# Patient Record
Sex: Female | Born: 1975
Health system: Southern US, Community
[De-identification: ages and names within clinical notes are randomized; demographics above are authoritative.]

## PROBLEM LIST (undated history)

## (undated) ENCOUNTER — Ambulatory Visit: Admission: EM | Payer: 59 | Source: Home / Self Care

## (undated) DIAGNOSIS — I2699 Other pulmonary embolism without acute cor pulmonale: Secondary | ICD-10-CM

## (undated) DIAGNOSIS — K219 Gastro-esophageal reflux disease without esophagitis: Secondary | ICD-10-CM

## (undated) DIAGNOSIS — R600 Localized edema: Secondary | ICD-10-CM

## (undated) DIAGNOSIS — F419 Anxiety disorder, unspecified: Secondary | ICD-10-CM

## (undated) HISTORY — PX: DILITATION & CURRETTAGE/HYSTROSCOPY WITH ESSURE: SHX5573

## (undated) HISTORY — DX: Gastro-esophageal reflux disease without esophagitis: K21.9

## (undated) HISTORY — PX: NO PAST SURGERIES: SHX2092

## (undated) HISTORY — DX: Anxiety disorder, unspecified: F41.9

---

## 2001-12-08 HISTORY — PX: CHOLECYSTECTOMY, LAPAROSCOPIC: SHX56

## 2001-12-08 HISTORY — PX: LEEP: SHX91

## 2005-01-15 ENCOUNTER — Emergency Department: Payer: Self-pay | Admitting: Emergency Medicine

## 2009-11-02 ENCOUNTER — Emergency Department: Payer: Self-pay | Admitting: Emergency Medicine

## 2012-07-24 ENCOUNTER — Emergency Department: Payer: Self-pay | Admitting: Unknown Physician Specialty

## 2013-05-31 DIAGNOSIS — N719 Inflammatory disease of uterus, unspecified: Secondary | ICD-10-CM | POA: Insufficient documentation

## 2013-06-24 DIAGNOSIS — F411 Generalized anxiety disorder: Secondary | ICD-10-CM | POA: Insufficient documentation

## 2013-12-21 DIAGNOSIS — F3281 Premenstrual dysphoric disorder: Secondary | ICD-10-CM | POA: Insufficient documentation

## 2015-07-13 DIAGNOSIS — F331 Major depressive disorder, recurrent, moderate: Secondary | ICD-10-CM | POA: Insufficient documentation

## 2016-06-19 DIAGNOSIS — F419 Anxiety disorder, unspecified: Secondary | ICD-10-CM | POA: Insufficient documentation

## 2016-06-19 DIAGNOSIS — I82409 Acute embolism and thrombosis of unspecified deep veins of unspecified lower extremity: Secondary | ICD-10-CM | POA: Insufficient documentation

## 2016-06-19 DIAGNOSIS — R000 Tachycardia, unspecified: Secondary | ICD-10-CM | POA: Insufficient documentation

## 2016-06-19 DIAGNOSIS — F32A Depression, unspecified: Secondary | ICD-10-CM | POA: Insufficient documentation

## 2017-06-04 DIAGNOSIS — E559 Vitamin D deficiency, unspecified: Secondary | ICD-10-CM | POA: Insufficient documentation

## 2018-12-11 ENCOUNTER — Ambulatory Visit
Admission: EM | Admit: 2018-12-11 | Discharge: 2018-12-11 | Disposition: A | Discharge status: Home / Self Care | Payer: Managed Care, Other (non HMO)

## 2018-12-11 ENCOUNTER — Other Ambulatory Visit: Payer: Self-pay

## 2018-12-11 DIAGNOSIS — J111 Influenza due to unidentified influenza virus with other respiratory manifestations: Secondary | ICD-10-CM | POA: Insufficient documentation

## 2018-12-11 LAB — RAPID INFLUENZA A&B ANTIGENS
Influenza A (ARMC): NEGATIVE
Influenza B (ARMC): POSITIVE — AB

## 2018-12-11 MED ORDER — HYDROCOD POLST-CPM POLST ER 10-8 MG/5ML PO SUER: 5.0000 mL | Freq: Two times a day (BID) | ORAL | 0 refills | Status: DC

## 2018-12-11 MED ORDER — BENZONATATE 200 MG PO CAPS: ORAL_CAPSULE | ORAL | 0 refills | Status: DC

## 2018-12-11 NOTE — ED Triage Notes (Signed)
Patient complains of body aches, headache and cough x Wednesday. Denies fever.

## 2018-12-11 NOTE — ED Provider Notes (Signed)
[facility]    CSN: [id number] Arrival date & time: 12/11/18  1005     History   Chief Complaint Chief Complaint  Patient presents with  . Generalized Body Aches    HPI Courtney Hanson is a 43 y.o. female.   HPI  Female presents with body aches headache and cough that she has had since Wednesday days prior to this visit.  She states that she did have her flu shot earlier this year.  She denies any fevers is afebrile at the present time.  Patient is requesting a flu test.        History reviewed. No pertinent past medical history.  There are no active problems to display for this patient.   Past Surgical History:  Procedure Laterality Date  . NO PAST SURGERIES      OB History   No obstetric history on file.      Home Medications    Prior to Admission medications   Medication Sig Start Date End Date Taking? Authorizing Provider  citalopram (CELEXA) 20 MG tablet Take by mouth. 10/22/18 10/22/19 Yes [provider]  benzonatate (TESSALON) 200 MG capsule Take one cap TID PRN cough 12/11/18   Lorin Picket, PA-C  chlorpheniramine-HYDROcodone (TUSSIONEX PENNKINETIC ER) 10-8 MG/5ML SUER Take 5 mLs by mouth 2 (two) times daily. 12/11/18   Lorin Picket, PA-C    Family History Family History  Problem Relation Age of Onset  . Hypertension Mother   . Diabetes Mother   . Hypertension Father     Social History Social History   Tobacco Use  . Smoking status: Never Smoker  . Smokeless tobacco: Never Used  Substance Use Topics  . Alcohol use: Not Currently  . Drug use: Not Currently     Allergies   Patient has no known allergies.   Review of Systems Review of Systems  Constitutional: Positive for activity change and fatigue. Negative for appetite change, chills and fever.  HENT: Positive for congestion and sore throat.   Respiratory: Positive for cough.   All other systems reviewed and are negative.    Physical Exam Triage  Vital Signs ED Triage Vitals  Enc Vitals Group     BP 12/11/18 1024 105/80     Pulse Rate 12/11/18 1024 87     Resp 12/11/18 1024 18     Temp 12/11/18 1024 98.6 F (37 C)     Temp Source 12/11/18 1024 Oral     SpO2 12/11/18 1024 99 %     Weight 12/11/18 1022 270 lb (122.5 kg)     Height 12/11/18 1022 5\' 1"  (1.549 m)     Head Circumference --      Peak Flow --      Pain Score 12/11/18 1022 4     Pain Loc --      Pain Edu? --      Excl. in [place]? --    No data found.  Updated Vital Signs BP 105/80 (BP Location: Left Arm)   Pulse 87   Temp 98.6 F (37 C) (Oral)   Resp 18   Ht 5\' 1"  (1.549 m)   Wt 270 lb (122.5 kg)   LMP 11/17/2018   SpO2 99%   BMI 51.02 kg/m   Visual Acuity Right Eye Distance:   Left Eye Distance:   Bilateral Distance:    Right Eye Near:   Left Eye Near:    Bilateral Near:     Physical Exam  Vitals signs and nursing note reviewed.  Constitutional:      General: She is not in acute distress.    Appearance: Normal appearance. She is obese. She is not ill-appearing, toxic-appearing or diaphoretic.  HENT:     Head: Normocephalic and atraumatic.     Right Ear: Tympanic membrane, ear canal and external ear normal.     Left Ear: Tympanic membrane, ear canal and external ear normal.     Nose: Nose normal. No congestion or rhinorrhea.     Mouth/Throat:     Mouth: Mucous membranes are moist.     Pharynx: No oropharyngeal exudate or posterior oropharyngeal erythema.  Eyes:     General:        Right eye: No discharge.        Left eye: No discharge.     Conjunctiva/sclera: Conjunctivae normal.  Neck:     Musculoskeletal: Normal range of motion.  Cardiovascular:     Rate and Rhythm: Normal rate and regular rhythm.  Pulmonary:     Effort: Pulmonary effort is normal.     Breath sounds: Normal breath sounds.  Musculoskeletal: Normal range of motion.  Lymphadenopathy:     Cervical: No cervical adenopathy.  Skin:    General: Skin is warm and dry.    Neurological:     General: No focal deficit present.     Mental Status: She is alert and oriented to person, place, and time.  Psychiatric:        Mood and Affect: Mood normal.        Behavior: Behavior normal.        Thought Content: Thought content normal.        Judgment: Judgment normal.      UC Treatments / Results  Labs (all labs ordered are listed, but only abnormal results are displayed) Labs Reviewed  RAPID INFLUENZA A&B ANTIGENS ([facility] ONLY) - Abnormal; Notable for the following components:      Result Value   Influenza B ([facility]) POSITIVE (*)    All other components within normal limits    EKG None  Radiology No results found.  Procedures Procedures (including critical care time)  Medications Ordered in UC Medications - No data to display  Initial Impression / Assessment and Plan / UC Course  I have reviewed the triage vital signs and the nursing notes.  Pertinent labs & imaging results that were available during my care of the patient were reviewed by me and considered in my medical decision making (see chart for details).    Has influenza B.  Not wish to use the Tamiflu.  Treat the cough symptomatically.  Keep her out of work on Monday and allow her to return to work on Tuesday.   Final Clinical Impressions(s) / UC Diagnoses   Final diagnoses:  Influenza   Discharge Instructions   None    ED Prescriptions    Medication Sig Dispense Auth. Provider   benzonatate (TESSALON) 200 MG capsule Take one cap TID PRN cough 30 capsule Lorin Picket, PA-C   chlorpheniramine-HYDROcodone (TUSSIONEX PENNKINETIC ER) 10-8 MG/5ML SUER Take 5 mLs by mouth 2 (two) times daily. 115 mL Lorin Picket, PA-C     Controlled Substance Prescriptions [place] Controlled Substance Registry consulted? Not Applicable   Lorin Picket, PA-C 12/11/18 1213

## 2019-09-23 DIAGNOSIS — G8929 Other chronic pain: Secondary | ICD-10-CM | POA: Insufficient documentation

## 2020-01-05 ENCOUNTER — Other Ambulatory Visit: Payer: Self-pay

## 2020-01-05 ENCOUNTER — Ambulatory Visit
Admission: EM | Admit: 2020-01-05 | Discharge: 2020-01-05 | Disposition: A | Discharge status: Home / Self Care | Payer: 59 | Attending: Physician Assistant | Admitting: Physician Assistant

## 2020-01-05 DIAGNOSIS — Z113 Encounter for screening for infections with a predominantly sexual mode of transmission: Secondary | ICD-10-CM | POA: Insufficient documentation

## 2020-01-05 NOTE — Discharge Instructions (Addendum)
Cytology, blood test sent, you will be contacted with any positive results that requires further treatment. Monitor for any worsening of symptoms, fever, abdominal pain, nausea, vomiting, to follow up for reevaluation.

## 2020-01-05 NOTE — ED Provider Notes (Signed)
[name]    CSN: [id number] Arrival date & time: 01/05/20  1730      History   Chief Complaint Chief Complaint  Patient presents with  . SEXUALLY TRANSMITTED DISEASE    HPI Courtney Hanson is a 44 y.o. female.   44 year old female comes in for STD testing. States had unprotected intercourse 1-2 weeks ago. She is asymptomatic. Denies urinary frequency, dysuria, hematuria. Denies abdominal pain, nausea, vomiting. Denies fever, chills, flank/back pain. Denies vaginal discharge, itching, spotting. LMP 12/29/2019.     History reviewed. No pertinent past medical history.  There are no problems to display for this patient.   Past Surgical History:  Procedure Laterality Date  . NO PAST SURGERIES      OB History   No obstetric history on file.      Home Medications    Prior to Admission medications   Medication Sig Start Date End Date Taking? Authorizing Provider  citalopram (CELEXA) 20 MG tablet Take by mouth. 10/22/18 10/22/19  [provider]    Family History Family History  Problem Relation Age of Onset  . Hypertension Mother   . Diabetes Mother   . Hypertension Father     Social History Social History   Tobacco Use  . Smoking status: Never Smoker  . Smokeless tobacco: Never Used  Substance Use Topics  . Alcohol use: Not Currently  . Drug use: Not Currently     Allergies   Patient has no known allergies.   Review of Systems Review of Systems  Reason unable to perform ROS: See HPI as above.     Physical Exam Triage Vital Signs ED Triage Vitals [01/05/20 1741]  Enc Vitals Group     BP (!) 145/95     Pulse Rate 98     Resp 18     Temp 98.2 F (36.8 C)     Temp Source Oral     SpO2 96 %     Weight      Height      Head Circumference      Peak Flow      Pain Score 0     Pain Loc      Pain Edu?      Excl. in [place]?    No data found.  Updated Vital Signs BP (!) 145/95 (BP Location: Left Arm)   Pulse 98    Temp 98.2 F (36.8 C) (Oral)   Resp 18   LMP 12/29/2019   SpO2 96%   Physical Exam Constitutional:      General: She is not in acute distress.    Appearance: Normal appearance. She is well-developed. She is not toxic-appearing or diaphoretic.  HENT:     Head: Normocephalic and atraumatic.  Eyes:     Conjunctiva/sclera: Conjunctivae normal.     Pupils: Pupils are equal, round, and reactive to light.  Pulmonary:     Effort: Pulmonary effort is normal. No respiratory distress.     Comments: Speaking in full sentences without difficulty Musculoskeletal:     Cervical back: Normal range of motion and neck supple.  Skin:    General: Skin is warm and dry.  Neurological:     Mental Status: She is alert and oriented to person, place, and time.    UC Treatments / Results  Labs (all labs ordered are listed, but only abnormal results are displayed) Labs Reviewed  HIV ANTIBODY (ROUTINE TESTING W REFLEX)  RPR  CERVICOVAGINAL ANCILLARY  ONLY    EKG   Radiology No results found.  Procedures Procedures (including critical care time)  Medications Ordered in UC Medications - No data to display  Initial Impression / Assessment and Plan / UC Course  I have reviewed the triage vital signs and the nursing notes.  Pertinent labs & imaging results that were available during my care of the patient were reviewed by me and considered in my medical decision making (see chart for details).    Cytology, HIV, RPR sent, patient will be contacted with any positive results that require additional treatment. Patient to refrain from sexual activity for the next 7 days. Return precautions given.   Final Clinical Impressions(s) / UC Diagnoses   Final diagnoses:  Screen for STD (sexually transmitted disease)   ED Prescriptions    None     PDMP not reviewed this encounter.   Ok Edwards, PA-C 01/05/20 1752

## 2020-01-05 NOTE — ED Triage Notes (Signed)
Pt requesting STD testing. States had un protective intercourse. Pt denies any sx's

## 2020-01-07 LAB — HIV ANTIBODY (ROUTINE TESTING W REFLEX): HIV Screen 4th Generation wRfx: NONREACTIVE

## 2020-01-07 LAB — RPR: RPR Ser Ql: NONREACTIVE

## 2020-01-09 LAB — CERVICOVAGINAL ANCILLARY ONLY
Chlamydia: NEGATIVE
Neisseria Gonorrhea: NEGATIVE
Trichomonas: NEGATIVE

## 2020-02-15 ENCOUNTER — Ambulatory Visit: Payer: Self-pay | Admitting: Internal Medicine

## 2020-04-10 DIAGNOSIS — R350 Frequency of micturition: Secondary | ICD-10-CM | POA: Diagnosis not present

## 2020-04-10 DIAGNOSIS — B373 Candidiasis of vulva and vagina: Secondary | ICD-10-CM | POA: Diagnosis not present

## 2020-04-10 DIAGNOSIS — N898 Other specified noninflammatory disorders of vagina: Secondary | ICD-10-CM | POA: Diagnosis not present

## 2020-05-04 DIAGNOSIS — Z9049 Acquired absence of other specified parts of digestive tract: Secondary | ICD-10-CM | POA: Diagnosis not present

## 2020-05-04 DIAGNOSIS — R079 Chest pain, unspecified: Secondary | ICD-10-CM | POA: Diagnosis not present

## 2020-05-04 DIAGNOSIS — R110 Nausea: Secondary | ICD-10-CM | POA: Diagnosis not present

## 2020-05-04 DIAGNOSIS — E559 Vitamin D deficiency, unspecified: Secondary | ICD-10-CM | POA: Diagnosis not present

## 2020-05-04 DIAGNOSIS — F329 Major depressive disorder, single episode, unspecified: Secondary | ICD-10-CM | POA: Diagnosis not present

## 2020-05-04 DIAGNOSIS — Z6841 Body Mass Index (BMI) 40.0 and over, adult: Secondary | ICD-10-CM | POA: Diagnosis not present

## 2020-05-04 DIAGNOSIS — K219 Gastro-esophageal reflux disease without esophagitis: Secondary | ICD-10-CM | POA: Diagnosis not present

## 2020-05-04 DIAGNOSIS — Z7901 Long term (current) use of anticoagulants: Secondary | ICD-10-CM | POA: Diagnosis not present

## 2020-05-04 DIAGNOSIS — I2699 Other pulmonary embolism without acute cor pulmonale: Secondary | ICD-10-CM | POA: Diagnosis not present

## 2020-05-04 DIAGNOSIS — F411 Generalized anxiety disorder: Secondary | ICD-10-CM | POA: Diagnosis not present

## 2020-05-04 DIAGNOSIS — R0602 Shortness of breath: Secondary | ICD-10-CM | POA: Diagnosis not present

## 2020-05-08 DIAGNOSIS — Z823 Family history of stroke: Secondary | ICD-10-CM | POA: Diagnosis not present

## 2020-05-08 DIAGNOSIS — R1013 Epigastric pain: Secondary | ICD-10-CM | POA: Diagnosis not present

## 2020-05-08 DIAGNOSIS — R110 Nausea: Secondary | ICD-10-CM | POA: Diagnosis not present

## 2020-05-08 DIAGNOSIS — Z833 Family history of diabetes mellitus: Secondary | ICD-10-CM | POA: Diagnosis not present

## 2020-05-08 DIAGNOSIS — Z7901 Long term (current) use of anticoagulants: Secondary | ICD-10-CM | POA: Diagnosis not present

## 2020-05-08 DIAGNOSIS — Z86718 Personal history of other venous thrombosis and embolism: Secondary | ICD-10-CM | POA: Diagnosis not present

## 2020-05-08 DIAGNOSIS — R197 Diarrhea, unspecified: Secondary | ICD-10-CM | POA: Diagnosis not present

## 2020-05-08 DIAGNOSIS — Z9049 Acquired absence of other specified parts of digestive tract: Secondary | ICD-10-CM | POA: Diagnosis not present

## 2020-05-08 DIAGNOSIS — Z8249 Family history of ischemic heart disease and other diseases of the circulatory system: Secondary | ICD-10-CM | POA: Diagnosis not present

## 2020-05-10 ENCOUNTER — Other Ambulatory Visit: Payer: Self-pay | Admitting: Family Medicine

## 2020-05-10 DIAGNOSIS — I2699 Other pulmonary embolism without acute cor pulmonale: Secondary | ICD-10-CM | POA: Diagnosis not present

## 2020-05-11 DIAGNOSIS — I2699 Other pulmonary embolism without acute cor pulmonale: Secondary | ICD-10-CM | POA: Diagnosis not present

## 2020-05-11 DIAGNOSIS — E669 Obesity, unspecified: Secondary | ICD-10-CM | POA: Diagnosis not present

## 2020-05-11 DIAGNOSIS — Z7901 Long term (current) use of anticoagulants: Secondary | ICD-10-CM | POA: Diagnosis not present

## 2020-05-11 DIAGNOSIS — M79652 Pain in left thigh: Secondary | ICD-10-CM | POA: Diagnosis not present

## 2020-05-11 DIAGNOSIS — Z86718 Personal history of other venous thrombosis and embolism: Secondary | ICD-10-CM | POA: Diagnosis not present

## 2020-05-15 DIAGNOSIS — Z1231 Encounter for screening mammogram for malignant neoplasm of breast: Secondary | ICD-10-CM | POA: Diagnosis not present

## 2020-05-19 DIAGNOSIS — Z79899 Other long term (current) drug therapy: Secondary | ICD-10-CM | POA: Diagnosis not present

## 2020-05-19 DIAGNOSIS — F329 Major depressive disorder, single episode, unspecified: Secondary | ICD-10-CM | POA: Diagnosis not present

## 2020-05-19 DIAGNOSIS — H81399 Other peripheral vertigo, unspecified ear: Secondary | ICD-10-CM | POA: Diagnosis not present

## 2020-05-19 DIAGNOSIS — Z7901 Long term (current) use of anticoagulants: Secondary | ICD-10-CM | POA: Diagnosis not present

## 2020-05-19 DIAGNOSIS — Z86718 Personal history of other venous thrombosis and embolism: Secondary | ICD-10-CM | POA: Diagnosis not present

## 2020-05-19 DIAGNOSIS — E559 Vitamin D deficiency, unspecified: Secondary | ICD-10-CM | POA: Diagnosis not present

## 2020-05-19 DIAGNOSIS — I2699 Other pulmonary embolism without acute cor pulmonale: Secondary | ICD-10-CM | POA: Diagnosis not present

## 2020-05-19 DIAGNOSIS — Z6841 Body Mass Index (BMI) 40.0 and over, adult: Secondary | ICD-10-CM | POA: Diagnosis not present

## 2020-05-19 DIAGNOSIS — R519 Headache, unspecified: Secondary | ICD-10-CM | POA: Diagnosis not present

## 2020-05-22 ENCOUNTER — Ambulatory Visit
Admission: EM | Admit: 2020-05-22 | Discharge: 2020-05-22 | Disposition: A | Discharge status: Home / Self Care | Payer: 59 | Attending: Emergency Medicine | Admitting: Emergency Medicine

## 2020-05-22 DIAGNOSIS — Z113 Encounter for screening for infections with a predominantly sexual mode of transmission: Secondary | ICD-10-CM | POA: Diagnosis not present

## 2020-05-22 DIAGNOSIS — Z7251 High risk heterosexual behavior: Secondary | ICD-10-CM

## 2020-05-22 DIAGNOSIS — Z114 Encounter for screening for human immunodeficiency virus [HIV]: Secondary | ICD-10-CM

## 2020-05-22 HISTORY — DX: Other pulmonary embolism without acute cor pulmonale: I26.99

## 2020-05-22 NOTE — ED Provider Notes (Signed)
[name]    CSN: [id number] Arrival date & time: 05/22/20  1738      History   Chief Complaint Chief Complaint  Patient presents with  . SEXUALLY TRANSMITTED DISEASE    HPI Courtney Hanson is a 44 y.o. female presenting for STI testing.  States she had unprotected intercourse a week ago: Requesting RPR and HIV as well.  Denies history of this.  No confirmed exposure or symptoms such as pelvic or vaginal pain, discharge, urinary symptoms.   Past Medical History:  Diagnosis Date  . Pulmonary embolism (HCC)     There are no problems to display for this patient.   Past Surgical History:  Procedure Laterality Date  . NO PAST SURGERIES      OB History   No obstetric history on file.      Home Medications    Prior to Admission medications   Medication Sig Start Date End Date Taking? Authorizing Provider  apixaban (ELIQUIS) 5 MG TABS tablet Take 5 mg by mouth 2 (two) times daily.   Yes [provider]  FLUoxetine (PROZAC) 20 MG tablet Take 20 mg by mouth daily.   Yes [provider]    Family History Family History  Problem Relation Age of Onset  . Hypertension Mother   . Diabetes Mother   . Hypertension Father     Social History Social History   Tobacco Use  . Smoking status: Never Smoker  . Smokeless tobacco: Never Used  Vaping Use  . Vaping Use: Never used  Substance Use Topics  . Alcohol use: Not Currently  . Drug use: Not Currently     Allergies   Patient has no known allergies.   Review of Systems As per HPI   Physical Exam Triage Vital Signs ED Triage Vitals  Enc Vitals Group     BP      Pulse      Resp      Temp      Temp src      SpO2      Weight      Height      Head Circumference      Peak Flow      Pain Score      Pain Loc      Pain Edu?      Excl. in [place]?    No data found.  Updated Vital Signs BP 120/84 (BP Location: Left Arm)   Pulse 97   Temp 98.2 F (36.8 C) (Oral)   Resp 18    LMP 05/18/2020   SpO2 98%   Visual Acuity Right Eye Distance:   Left Eye Distance:   Bilateral Distance:    Right Eye Near:   Left Eye Near:    Bilateral Near:     Physical Exam Constitutional:      General: She is not in acute distress. HENT:     Head: Normocephalic and atraumatic.  Eyes:     General: No scleral icterus.    Pupils: Pupils are equal, round, and reactive to light.  Cardiovascular:     Rate and Rhythm: Normal rate.  Pulmonary:     Effort: Pulmonary effort is normal.  Abdominal:     General: Bowel sounds are normal.     Palpations: Abdomen is soft.     Tenderness: There is no abdominal tenderness. There is no right CVA tenderness, left CVA tenderness or guarding.  Genitourinary:    Comments: Patient declined,  self-swab performed Skin:    Coloration: Skin is not jaundiced or pale.  Neurological:     Mental Status: She is alert and oriented to person, place, and time.      UC Treatments / Results  Labs (all labs ordered are listed, but only abnormal results are displayed) Labs Reviewed  HIV ANTIBODY (ROUTINE TESTING W REFLEX)  RPR  CERVICOVAGINAL ANCILLARY ONLY    EKG   Radiology No results found.  Procedures Procedures (including critical care time)  Medications Ordered in UC Medications - No data to display  Initial Impression / Assessment and Plan / UC Course  I have reviewed the triage vital signs and the nursing notes.  Pertinent labs & imaging results that were available during my care of the patient were reviewed by me and considered in my medical decision making (see chart for details).     Testing pending: We will treat if indicated.  Return precautions discussed, patient verbalized understanding and is agreeable to plan. Final Clinical Impressions(s) / UC Diagnoses   Final diagnoses:  Screening examination for venereal disease  Screening for human immunodeficiency virus  Unprotected sex     Discharge Instructions       Please check MyChart for test results.    ED Prescriptions    None     PDMP not reviewed this encounter.   [place], [place], [place] 05/22/20 1827

## 2020-05-22 NOTE — ED Triage Notes (Signed)
Pt requesting STD testing. States had un protective intercourse a week ago. Pt denies any s/x's

## 2020-05-22 NOTE — Discharge Instructions (Signed)
Please check MyChart for test results.

## 2020-05-24 LAB — CERVICOVAGINAL ANCILLARY ONLY
Chlamydia: NEGATIVE
Comment: NEGATIVE
Comment: NEGATIVE
Comment: NORMAL
Neisseria Gonorrhea: NEGATIVE
Trichomonas: NEGATIVE

## 2020-05-24 LAB — RPR: RPR Ser Ql: NONREACTIVE

## 2020-05-24 LAB — HIV ANTIBODY (ROUTINE TESTING W REFLEX): HIV Screen 4th Generation wRfx: NONREACTIVE

## 2020-06-01 MED FILL — ELIQUIS 5 MG TABLET: 5 | 30 days supply | Qty: 60 | Fill #0

## 2020-06-29 MED FILL — ELIQUIS 5 MG TABLET: 5 | 30 days supply | Qty: 60 | Fill #1

## 2020-07-27 MED FILL — ELIQUIS 5 MG TABLET: 5 | 30 days supply | Qty: 60 | Fill #2

## 2020-08-22 DIAGNOSIS — Z6841 Body Mass Index (BMI) 40.0 and over, adult: Secondary | ICD-10-CM | POA: Diagnosis not present

## 2020-08-22 DIAGNOSIS — Z113 Encounter for screening for infections with a predominantly sexual mode of transmission: Secondary | ICD-10-CM | POA: Diagnosis not present

## 2020-08-22 DIAGNOSIS — Z20822 Contact with and (suspected) exposure to covid-19: Secondary | ICD-10-CM | POA: Diagnosis not present

## 2020-10-08 DIAGNOSIS — Z7901 Long term (current) use of anticoagulants: Secondary | ICD-10-CM | POA: Diagnosis not present

## 2020-10-08 DIAGNOSIS — I2699 Other pulmonary embolism without acute cor pulmonale: Secondary | ICD-10-CM | POA: Diagnosis not present

## 2020-10-08 DIAGNOSIS — I824Y2 Acute embolism and thrombosis of unspecified deep veins of left proximal lower extremity: Secondary | ICD-10-CM | POA: Diagnosis not present

## 2020-10-08 DIAGNOSIS — Z6841 Body Mass Index (BMI) 40.0 and over, adult: Secondary | ICD-10-CM | POA: Diagnosis not present

## 2020-10-18 DIAGNOSIS — F331 Major depressive disorder, recurrent, moderate: Secondary | ICD-10-CM | POA: Diagnosis not present

## 2020-10-18 DIAGNOSIS — R7303 Prediabetes: Secondary | ICD-10-CM | POA: Insufficient documentation

## 2020-10-18 DIAGNOSIS — Z6841 Body Mass Index (BMI) 40.0 and over, adult: Secondary | ICD-10-CM | POA: Diagnosis not present

## 2020-12-06 ENCOUNTER — Other Ambulatory Visit: Payer: Self-pay | Admitting: Hematology and Oncology

## 2020-12-17 DIAGNOSIS — H524 Presbyopia: Secondary | ICD-10-CM | POA: Diagnosis not present

## 2020-12-28 ENCOUNTER — Other Ambulatory Visit: Payer: 59 | Attending: Family Medicine

## 2021-01-02 ENCOUNTER — Encounter: Payer: Self-pay | Admitting: Emergency Medicine

## 2021-01-02 ENCOUNTER — Ambulatory Visit
Admission: EM | Admit: 2021-01-02 | Discharge: 2021-01-02 | Disposition: A | Discharge status: Home / Self Care | Payer: 59 | Attending: Physician Assistant | Admitting: Physician Assistant

## 2021-01-02 ENCOUNTER — Other Ambulatory Visit: Payer: Self-pay

## 2021-01-02 DIAGNOSIS — Z113 Encounter for screening for infections with a predominantly sexual mode of transmission: Secondary | ICD-10-CM | POA: Diagnosis not present

## 2021-01-02 DIAGNOSIS — N760 Acute vaginitis: Secondary | ICD-10-CM | POA: Diagnosis not present

## 2021-01-02 DIAGNOSIS — B9689 Other specified bacterial agents as the cause of diseases classified elsewhere: Secondary | ICD-10-CM | POA: Diagnosis not present

## 2021-01-02 LAB — WET PREP, GENITAL
Sperm: NONE SEEN
Trich, Wet Prep: NONE SEEN
Yeast Wet Prep HPF POC: NONE SEEN

## 2021-01-02 LAB — CHLAMYDIA/NGC RT PCR (ARMC ONLY)
Chlamydia Tr: NOT DETECTED
N gonorrhoeae: NOT DETECTED

## 2021-01-02 MED ORDER — METRONIDAZOLE 500 MG PO TABS: 500.0000 mg | ORAL_TABLET | Freq: Two times a day (BID) | ORAL | 0 refills | Status: AC

## 2021-01-02 NOTE — ED Triage Notes (Signed)
Pt requesting STD screening. She is not having symptoms but has a possible exposure.

## 2021-01-02 NOTE — Discharge Instructions (Signed)
You tested positive for BV today.  Begin the Flagyl.  We have tested you for gonorrhea and chlamydia and those results will be available within 24 hours.  If 1 of those results is positive you will need treatment and we will let you know.  Results will be accessible through [place].  We will call if results are positive.  Make sure to always use protection when having intercourse.  Practice safe sex.

## 2021-01-02 NOTE — ED Provider Notes (Signed)
[facility]    CSN: [id number] Arrival date & time: 01/02/21  1746      History   Chief Complaint Chief Complaint  Patient presents with  . Exposure to STD    HPI Courtney Hanson is a 45 y.o. female presenting for STI screening.  Patient states that she just found out that her current sexual partner has been having intercourse with other partners.  Patient denies any symptoms and states that he did not tolerate tested positive for anything.  She says that they did not use protection when having intercourse.  She denies any dysuria, vaginal discharge, vaginal lesions or itching.  Patient requesting testing for trichomonas, chlamydia and gonorrhea.  Declines any testing for HIV or syphilis.  Last menstrual period was 12/26/2020.  No other concerns or complaints today.  HPI  Past Medical History:  Diagnosis Date  . Pulmonary embolism (HCC)     There are no problems to display for this patient.   Past Surgical History:  Procedure Laterality Date  . NO PAST SURGERIES      OB History   No obstetric history on file.      Home Medications    Prior to Admission medications   Medication Sig Start Date End Date Taking? Authorizing Provider  apixaban (ELIQUIS) 5 MG TABS tablet Take 5 mg by mouth 2 (two) times daily.   Yes [provider]  FLUoxetine (PROZAC) 20 MG tablet Take 20 mg by mouth daily.   Yes [provider]  metroNIDAZOLE (FLAGYL) 500 MG tablet Take 1 tablet (500 mg total) by mouth 2 (two) times daily for 7 days. 01/02/21 01/09/21 Yes Danton Clap, PA-C    Family History Family History  Problem Relation Age of Onset  . Hypertension Mother   . Diabetes Mother   . Hypertension Father     Social History Social History   Tobacco Use  . Smoking status: Never Smoker  . Smokeless tobacco: Never Used  Vaping Use  . Vaping Use: Never used  Substance Use Topics  . Alcohol use: Not Currently  . Drug use: Not Currently      Allergies   Patient has no known allergies.   Review of Systems Review of Systems  Gastrointestinal: Negative for abdominal pain, nausea and vomiting.  Genitourinary: Negative for dysuria, frequency, genital sores, pelvic pain, vaginal discharge and vaginal pain.  Musculoskeletal: Negative for back pain.  Skin: Negative for rash.     Physical Exam Triage Vital Signs ED Triage Vitals  Enc Vitals Group     BP 01/02/21 1803 122/87     Pulse Rate 01/02/21 1803 88     Resp 01/02/21 1803 18     Temp 01/02/21 1803 98.4 F (36.9 C)     Temp Source 01/02/21 1803 Oral     SpO2 01/02/21 1803 100 %     Weight 01/02/21 1802 270 lb 1 oz (122.5 kg)     Height 01/02/21 1802 5\' 1"  (1.549 m)     Head Circumference --      Peak Flow --      Pain Score 01/02/21 1802 0     Pain Loc --      Pain Edu? --      Excl. in [place]? --    No data found.  Updated Vital Signs BP 122/87 (BP Location: Right Arm)   Pulse 88   Temp 98.4 F (36.9 C) (Oral)   Resp 18   Ht 5'  1" (1.549 m)   Wt 270 lb 1 oz (122.5 kg)   LMP 12/26/2020   SpO2 100%   BMI 51.03 kg/m       Physical Exam Vitals and nursing note reviewed.  Constitutional:      General: She is not in acute distress.    Appearance: Normal appearance. She is obese. She is not ill-appearing or toxic-appearing.  HENT:     Head: Normocephalic and atraumatic.  Eyes:     General: No scleral icterus.       Right eye: No discharge.        Left eye: No discharge.     Conjunctiva/sclera: Conjunctivae normal.  Cardiovascular:     Rate and Rhythm: Normal rate and regular rhythm.     Heart sounds: Normal heart sounds.  Pulmonary:     Effort: Pulmonary effort is normal. No respiratory distress.     Breath sounds: Normal breath sounds.  Abdominal:     Palpations: Abdomen is soft.     Tenderness: There is no abdominal tenderness. There is no right CVA tenderness or left CVA tenderness.  Musculoskeletal:     Cervical back: Neck supple.   Skin:    General: Skin is dry.  Neurological:     General: No focal deficit present.     Mental Status: She is alert. Mental status is at baseline.     Motor: No weakness.     Gait: Gait normal.  Psychiatric:        Mood and Affect: Mood normal.        Behavior: Behavior normal.        Thought Content: Thought content normal.      UC Treatments / Results  Labs (all labs ordered are listed, but only abnormal results are displayed) Labs Reviewed  WET PREP, GENITAL - Abnormal; Notable for the following components:      Result Value   Clue Cells Wet Prep HPF POC PRESENT (*)    WBC, Wet Prep HPF POC FEW (*)    All other components within normal limits  CHLAMYDIA/NGC RT PCR [facility] ONLY)    EKG   Radiology No results found.  Procedures Procedures (including critical care time)  Medications Ordered in UC Medications - No data to display  Initial Impression / Assessment and Plan / UC Course  I have reviewed the triage vital signs and the nursing notes.  Pertinent labs & imaging results that were available during my care of the patient were reviewed by me and considered in my medical decision making (see chart for details).   45 year old asymptomatic female presenting for STI screening.  GC and Chlamydia testing sent out.  Advised patient had access results through [place].  Patient would like to wait for results before being treated since she does not have a positive exposure.  Wet prep is positive for clue cells.  Treating for BV infection at this time with metronidazole.  Advised safe sex in the future.  Advised to follow-up the clinic as needed.   Final Clinical Impressions(s) / UC Diagnoses   Final diagnoses:  Bacterial vaginosis  Screen for STD (sexually transmitted disease)     Discharge Instructions     You tested positive for BV today.  Begin the Flagyl.  We have tested you for gonorrhea and chlamydia and those results will be available within 24  hours.  If 1 of those results is positive you will need treatment and we will let you know.  Results  will be accessible through [place].  We will call if results are positive.  Make sure to always use protection when having intercourse.  Practice safe sex.   ED Prescriptions    Medication Sig Dispense Auth. Provider   metroNIDAZOLE (FLAGYL) 500 MG tablet Take 1 tablet (500 mg total) by mouth 2 (two) times daily for 7 days. 14 tablet Gretta Cool     PDMP not reviewed this encounter.   Danton Clap, PA-C 01/02/21 [phone]

## 2021-01-08 ENCOUNTER — Other Ambulatory Visit: Payer: Self-pay | Admitting: Family Medicine

## 2021-01-21 ENCOUNTER — Telehealth (HOSPITAL_COMMUNITY): Payer: Self-pay | Admitting: Emergency Medicine

## 2021-01-21 MED ORDER — FLUCONAZOLE 150 MG PO TABS: 150.0000 mg | ORAL_TABLET | Freq: Once | ORAL | 0 refills | Status: AC

## 2021-01-21 NOTE — Telephone Encounter (Signed)
Patient called and states she has developed a yeast infection since beginning antibiotics from her visit.  Spoke to [place], [place] who okay'd prescription for Diflucan.   Attempted to inform patient, unable to LVM.  Prescription sent

## 2021-01-30 DIAGNOSIS — M7711 Lateral epicondylitis, right elbow: Secondary | ICD-10-CM | POA: Diagnosis not present

## 2021-01-30 DIAGNOSIS — Z6841 Body Mass Index (BMI) 40.0 and over, adult: Secondary | ICD-10-CM | POA: Diagnosis not present

## 2021-02-08 DIAGNOSIS — Z124 Encounter for screening for malignant neoplasm of cervix: Secondary | ICD-10-CM | POA: Diagnosis not present

## 2021-02-08 DIAGNOSIS — Z0000 Encounter for general adult medical examination without abnormal findings: Secondary | ICD-10-CM | POA: Diagnosis not present

## 2021-02-08 DIAGNOSIS — Z6841 Body Mass Index (BMI) 40.0 and over, adult: Secondary | ICD-10-CM | POA: Diagnosis not present

## 2021-02-08 DIAGNOSIS — E559 Vitamin D deficiency, unspecified: Secondary | ICD-10-CM | POA: Diagnosis not present

## 2021-02-08 DIAGNOSIS — R7303 Prediabetes: Secondary | ICD-10-CM | POA: Diagnosis not present

## 2021-02-08 DIAGNOSIS — Z113 Encounter for screening for infections with a predominantly sexual mode of transmission: Secondary | ICD-10-CM | POA: Diagnosis not present

## 2021-03-09 ENCOUNTER — Other Ambulatory Visit: Payer: Self-pay

## 2021-03-09 MED FILL — Apixaban Tab 5 MG: ORAL | 30 days supply | Qty: 60 | Fill #0 | Status: AC

## 2021-03-11 ENCOUNTER — Other Ambulatory Visit: Payer: Self-pay

## 2021-03-11 MED ORDER — FLUOXETINE HCL 40 MG PO CAPS
40.0000 mg | ORAL_CAPSULE | Freq: Every day | ORAL | 0 refills | Status: DC
  Filled 2021-03-11: qty 90, 90d supply, fill #0
  Filled 2021-06-10: qty 90, 90d supply, fill #1
  Filled 2021-09-07: qty 60, 60d supply, fill #2

## 2021-04-08 MED FILL — Apixaban Tab 5 MG: ORAL | 30 days supply | Qty: 60 | Fill #1 | Status: AC

## 2021-04-09 ENCOUNTER — Other Ambulatory Visit: Payer: Self-pay

## 2021-05-09 ENCOUNTER — Other Ambulatory Visit: Payer: Self-pay

## 2021-05-09 MED FILL — Apixaban Tab 5 MG: ORAL | 30 days supply | Qty: 60 | Fill #2 | Status: AC

## 2021-05-14 DIAGNOSIS — Z124 Encounter for screening for malignant neoplasm of cervix: Secondary | ICD-10-CM | POA: Diagnosis not present

## 2021-05-14 DIAGNOSIS — R87611 Atypical squamous cells cannot exclude high grade squamous intraepithelial lesion on cytologic smear of cervix (ASC-H): Secondary | ICD-10-CM | POA: Diagnosis not present

## 2021-05-14 DIAGNOSIS — R87619 Unspecified abnormal cytological findings in specimens from cervix uteri: Secondary | ICD-10-CM | POA: Diagnosis not present

## 2021-06-04 ENCOUNTER — Other Ambulatory Visit: Payer: Self-pay

## 2021-06-04 ENCOUNTER — Encounter: Payer: Self-pay | Admitting: Emergency Medicine

## 2021-06-04 ENCOUNTER — Ambulatory Visit
Admission: EM | Admit: 2021-06-04 | Discharge: 2021-06-04 | Disposition: A | Discharge status: Home / Self Care | Payer: 59 | Attending: Physician Assistant | Admitting: Physician Assistant

## 2021-06-04 DIAGNOSIS — N898 Other specified noninflammatory disorders of vagina: Secondary | ICD-10-CM | POA: Diagnosis not present

## 2021-06-04 LAB — POCT URINALYSIS DIP (MANUAL ENTRY)
Bilirubin, UA: NEGATIVE
Blood, UA: NEGATIVE
Glucose, UA: NEGATIVE mg/dL
Ketones, POC UA: NEGATIVE mg/dL
Leukocytes, UA: NEGATIVE
Nitrite, UA: NEGATIVE
Protein Ur, POC: NEGATIVE mg/dL
Spec Grav, UA: 1.02 (ref 1.010–1.025)
Urobilinogen, UA: 0.2 E.U./dL
pH, UA: 6.5 (ref 5.0–8.0)

## 2021-06-04 NOTE — ED Triage Notes (Addendum)
White vaginal discharge x 1 week.  Would like to be checked for std's/ yeast infection. Also reports some burning w urination

## 2021-06-04 NOTE — Discharge Instructions (Addendum)
Return if any problems.  Your test are pending

## 2021-06-05 LAB — CERVICOVAGINAL ANCILLARY ONLY
Bacterial Vaginitis (gardnerella): POSITIVE — AB
Candida Glabrata: NEGATIVE
Candida Vaginitis: NEGATIVE
Chlamydia: NEGATIVE
Comment: NEGATIVE
Comment: NEGATIVE
Comment: NEGATIVE
Comment: NEGATIVE
Comment: NEGATIVE
Comment: NORMAL
Neisseria Gonorrhea: NEGATIVE
Trichomonas: NEGATIVE

## 2021-06-06 ENCOUNTER — Telehealth (HOSPITAL_COMMUNITY): Payer: Self-pay | Admitting: Emergency Medicine

## 2021-06-06 MED ORDER — METRONIDAZOLE 500 MG PO TABS: 500.0000 mg | ORAL_TABLET | Freq: Two times a day (BID) | ORAL | 0 refills | Status: DC

## 2021-06-10 MED FILL — Apixaban Tab 5 MG: ORAL | 30 days supply | Qty: 60 | Fill #3 | Status: AC

## 2021-06-11 ENCOUNTER — Other Ambulatory Visit: Payer: Self-pay

## 2021-06-20 DIAGNOSIS — I824Y2 Acute embolism and thrombosis of unspecified deep veins of left proximal lower extremity: Secondary | ICD-10-CM | POA: Diagnosis not present

## 2021-06-20 DIAGNOSIS — R002 Palpitations: Secondary | ICD-10-CM | POA: Diagnosis not present

## 2021-06-20 DIAGNOSIS — D649 Anemia, unspecified: Secondary | ICD-10-CM | POA: Diagnosis not present

## 2021-06-20 DIAGNOSIS — Z6841 Body Mass Index (BMI) 40.0 and over, adult: Secondary | ICD-10-CM | POA: Diagnosis not present

## 2021-06-28 DIAGNOSIS — N879 Dysplasia of cervix uteri, unspecified: Secondary | ICD-10-CM | POA: Diagnosis not present

## 2021-06-28 DIAGNOSIS — R002 Palpitations: Secondary | ICD-10-CM | POA: Diagnosis not present

## 2021-06-28 DIAGNOSIS — R87611 Atypical squamous cells cannot exclude high grade squamous intraepithelial lesion on cytologic smear of cervix (ASC-H): Secondary | ICD-10-CM | POA: Diagnosis not present

## 2021-06-28 DIAGNOSIS — R87619 Unspecified abnormal cytological findings in specimens from cervix uteri: Secondary | ICD-10-CM | POA: Diagnosis not present

## 2021-06-28 DIAGNOSIS — Z6841 Body Mass Index (BMI) 40.0 and over, adult: Secondary | ICD-10-CM | POA: Diagnosis not present

## 2021-06-28 DIAGNOSIS — Z7901 Long term (current) use of anticoagulants: Secondary | ICD-10-CM | POA: Diagnosis not present

## 2021-07-04 DIAGNOSIS — I824Y2 Acute embolism and thrombosis of unspecified deep veins of left proximal lower extremity: Secondary | ICD-10-CM | POA: Diagnosis not present

## 2021-07-04 DIAGNOSIS — R002 Palpitations: Secondary | ICD-10-CM | POA: Diagnosis not present

## 2021-07-12 ENCOUNTER — Other Ambulatory Visit: Payer: Self-pay

## 2021-07-12 MED FILL — Apixaban Tab 5 MG: ORAL | 27 days supply | Qty: 54 | Fill #4 | Status: AC

## 2021-07-12 MED FILL — Apixaban Tab 5 MG: ORAL | 3 days supply | Qty: 6 | Fill #4 | Status: AC

## 2021-07-15 ENCOUNTER — Other Ambulatory Visit: Payer: Self-pay

## 2021-07-16 ENCOUNTER — Other Ambulatory Visit: Payer: Self-pay

## 2021-07-16 MED ORDER — CARESTART COVID-19 HOME TEST VI KIT
PACK | 0 refills | Status: DC
  Filled 2021-07-16: qty 2, 4d supply, fill #0

## 2021-08-05 ENCOUNTER — Other Ambulatory Visit: Payer: Self-pay

## 2021-08-05 DIAGNOSIS — N898 Other specified noninflammatory disorders of vagina: Secondary | ICD-10-CM | POA: Diagnosis not present

## 2021-08-05 DIAGNOSIS — R350 Frequency of micturition: Secondary | ICD-10-CM | POA: Diagnosis not present

## 2021-08-05 MED ORDER — NITROFURANTOIN MONOHYD MACRO 100 MG PO CAPS
ORAL_CAPSULE | ORAL | 0 refills | Status: DC
  Filled 2021-08-05: qty 14, 7d supply, fill #0

## 2021-08-05 MED ORDER — FLUCONAZOLE 150 MG PO TABS
ORAL_TABLET | ORAL | 1 refills | Status: DC
  Filled 2021-08-05: qty 1, 1d supply, fill #0

## 2021-08-08 ENCOUNTER — Other Ambulatory Visit: Payer: Self-pay

## 2021-08-08 MED FILL — Apixaban Tab 5 MG: ORAL | 30 days supply | Qty: 60 | Fill #5 | Status: AC

## 2021-08-09 ENCOUNTER — Other Ambulatory Visit: Payer: Self-pay

## 2021-08-09 MED ORDER — AMOXICILLIN 875 MG PO TABS
ORAL_TABLET | ORAL | 0 refills | Status: DC
  Filled 2021-08-09: qty 14, 7d supply, fill #0

## 2021-08-30 ENCOUNTER — Encounter: Payer: Self-pay | Admitting: *Deleted

## 2021-09-07 MED FILL — Apixaban Tab 5 MG: ORAL | 30 days supply | Qty: 60 | Fill #6 | Status: AC

## 2021-09-09 ENCOUNTER — Other Ambulatory Visit: Payer: Self-pay

## 2021-09-18 ENCOUNTER — Encounter: Payer: Self-pay | Admitting: Gastroenterology

## 2021-09-18 ENCOUNTER — Other Ambulatory Visit: Payer: Self-pay

## 2021-09-18 ENCOUNTER — Ambulatory Visit (INDEPENDENT_AMBULATORY_CARE_PROVIDER_SITE_OTHER): Payer: 59 | Admitting: Gastroenterology

## 2021-09-18 VITALS — BP 123/85 | HR 109 | Temp 97.7°F | Ht 64.0 in | Wt 347.8 lb

## 2021-09-18 DIAGNOSIS — K219 Gastro-esophageal reflux disease without esophagitis: Secondary | ICD-10-CM

## 2021-09-18 DIAGNOSIS — K529 Noninfective gastroenteritis and colitis, unspecified: Secondary | ICD-10-CM | POA: Diagnosis not present

## 2021-09-18 DIAGNOSIS — R110 Nausea: Secondary | ICD-10-CM | POA: Diagnosis not present

## 2021-09-18 MED ORDER — PEG 3350-KCL-NA BICARB-NACL 420 G PO SOLR
4000.0000 mL | Freq: Once | ORAL | 0 refills | Status: AC
  Filled 2021-09-18: qty 4000, 1d supply, fill #0

## 2021-09-18 NOTE — Addendum Note (Signed)
Addended by: Cephas Darby on: 09/18/2021 03:29 PM   Modules accepted: Orders

## 2021-09-18 NOTE — Progress Notes (Addendum)
Cephas Darby, MD [address]  [place]  [place],  [address]  Main: [phone]  Fax: [phone]    Gastroenterology Consultation  Referring Provider:     Dola Argyle, MD Primary Care Physician:  Dola Argyle, MD Primary Gastroenterologist:  Dr. Cephas Darby Reason for Consultation:     Chronic nausea and chronic diarrhea        HPI:   Courtney Hanson is a 45 y.o. female referred by Dr. Dola Argyle, MD  for consultation & management of chronic nausea.  Patient reports that she has been experiencing episodes of severe nausea, particularly early morning after she wakes up and sometimes associated with regurgitation of food.  Her nausea episodes are generally associated with postprandial nonbloody diarrhea.  These episodes occur few times a week.  She takes over-the-counter PPI as needed which provides some relief.  This has been ongoing for several months.  Patient has history of PE, currently on Eliquis.  Patient does report some epigastric discomfort and bloating.  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 [place]  NSAIDs: None  Antiplts/Anticoagulants/Anti thrombotics: None  GI Procedures: None She denies family history of GI malignancy  Past Medical History:  Diagnosis Date   Pulmonary embolism ([place])     Past Surgical History:  Procedure Laterality Date   NO PAST SURGERIES     Current Outpatient Medications:    albuterol (VENTOLIN HFA) 108 (90 Base) MCG/ACT inhaler, Inhale into the lungs., Disp: , Rfl:    ALPRAZolam (XANAX) 0.5 MG tablet, Take by mouth., Disp: , Rfl:    apixaban (ELIQUIS) 5 MG TABS tablet, TAKE 1 TABLET BY MOUTH TWICE DAILY, Disp: 60 tablet, Rfl: 10   clonazePAM (KLONOPIN) 1 MG tablet, Take by mouth., Disp: , Rfl:    COVID-19 At Home Antigen Test (CARESTART COVID-19 HOME TEST) KIT, use as directed, Disp: 2 kit, Rfl: 0    cyclobenzaprine (FLEXERIL) 5 MG tablet, Take by mouth., Disp: , Rfl:    fluconazole (DIFLUCAN) 150 MG tablet, Take one tablet (150 mg dose) by mouth once for 1 dose. Take one tablet by mouth as a single dose., Disp: 1 tablet, Rfl: 1   FLUoxetine (PROZAC) 20 MG tablet, Take 20 mg by mouth daily., Disp: , Rfl:    furosemide (LASIX) 20 MG tablet, Take by mouth., Disp: , Rfl:    LORazepam (ATIVAN) 0.5 MG tablet, Take by mouth., Disp: , Rfl:    polyethylene glycol-electrolytes (NULYTELY) 420 g solution, Take 4,000 mLs by mouth once for 1 dose., Disp: 4000 mL, Rfl: 0   potassium chloride (MICRO-K) 10 MEQ CR capsule, Take by mouth., Disp: , Rfl:    promethazine (PHENERGAN) 25 MG tablet, Takes as needed, Disp: , Rfl:    topiramate (TOPAMAX) 25 MG tablet, Take by mouth., Disp: , Rfl:    nitrofurantoin, macrocrystal-monohydrate, (MACROBID) 100 MG capsule, Take one capsule (100 mg dose) by mouth 2 (two) times daily for 7 days. (Patient not taking: Reported on 09/18/2021), Disp: 14 capsule, Rfl: 0   zolpidem (AMBIEN CR) 6.25 MG CR tablet, TAKE 1 TABLET BY MOUTH NIGHTLY AS NEEDED FOR SLEEP, Disp: 30 tablet, Rfl: 0    Family History  Problem Relation Age of Onset   Hypertension Mother    Diabetes Mother    Hypertension Father      Social History   Tobacco Use  Smoking status: Never   Smokeless tobacco: Never  Vaping Use   Vaping Use: Never used  Substance Use Topics   Alcohol use: Not Currently   Drug use: Not Currently    Allergies as of 09/18/2021 - Review Complete 09/18/2021  Allergen Reaction Noted   Tetanus toxoid Dermatitis, Hives, Itching, and Rash 07/13/2015   Topiramate  02/08/2021   Venlafaxine  01/06/2020    Review of Systems:    All systems reviewed and negative except where noted in HPI.   Physical Exam:  BP 123/85 (BP Location: Left Arm, Patient Position: Sitting, Cuff Size: Large)   Pulse (!) 109   Temp 97.7 F (36.5 C) (Oral)   Ht 5' 4"  (1.626 m)   Wt (!) 347 lb  12.8 oz (157.8 kg)   BMI 59.70 kg/m  No LMP recorded.  General:   Alert,  Well-developed, well-nourished, pleasant and cooperative in NAD Head:  Normocephalic and atraumatic. Eyes:  Sclera clear, no icterus.   Conjunctiva pink. Ears:  Normal auditory acuity. Nose:  No deformity, discharge, or lesions. Mouth:  No deformity or lesions,oropharynx pink & moist. Neck:  Supple; no masses or thyromegaly. Lungs:  Respirations even and unlabored.  Clear throughout to auscultation.   No wheezes, crackles, or rhonchi. No acute distress. Heart:  Regular rate and rhythm; no murmurs, clicks, rubs, or gallops. Abdomen:  Normal bowel sounds. Soft, non-tender and non-distended without masses, No guarding or rebound tenderness.   Rectal: Not performed Msk:  Symmetrical without gross deformities. Good, equal movement & strength bilaterally. Pulses:  Normal pulses noted. Extremities:  No clubbing or edema.  No cyanosis. Neurologic:  Alert and oriented x3;  grossly normal neurologically. Skin:  Intact without significant lesions or rashes. No jaundice. Psych:  Alert and cooperative. Normal mood and affect.  Imaging Studies: No abdominal imaging  Assessment and Plan:   Courtney Hanson is a 45 y.o. pleasant African-American female with morbid obesity, BMI 59, history of PE maintained on long-term anticoagulation with Eliquis is seen in consultation for chronic symptoms of nausea without vomiting, chronic nonbloody diarrhea  Early morning nausea without vomiting and intermittent heartburn: Her symptoms are likely suggestive of chronic GERD Recommend EGD for Barrett's screening and gastric biopsies Discussed about GERD lifestyle, information provided  Chronic nonbloody diarrhea Recommend GI profile PCR Check pancreatic fecal elastase levels After above work-up, recommend screening colonoscopy and random colon biopsies if needed  Will obtain blood thinner request from her hematologist for interruption of  Eliquis during periprocedural period  Morbid obesity Provided her with information about bariatric and wellness center at [place], [facility]   Follow up in 4 months   Cephas Darby, MD

## 2021-09-18 NOTE — Patient Instructions (Signed)
Gastroesophageal Reflux Disease, Adult Gastroesophageal reflux (GER) happens when acid from the stomach flows up into the tube that connects the mouth and the stomach (esophagus). Normally, food travels down the esophagus and stays in the stomach to be digested. With GER, food and stomach acid sometimes move back up into the esophagus. You may have a disease called gastroesophageal reflux disease (GERD) if the reflux: Happens often. Causes frequent or very bad symptoms. Causes problems such as damage to the esophagus. When this happens, the esophagus becomes sore and swollen. Over time, GERD can make small holes (ulcers) in the lining of the esophagus. What are the causes? This condition is caused by a problem with the muscle between the esophagus and the stomach. When this muscle is weak or not normal, it does not close properly to keep food and acid from coming back up from the stomach. The muscle can be weak because of: Tobacco use. Pregnancy. Having a certain type of hernia (hiatal hernia). Alcohol use. Certain foods and drinks, such as coffee, chocolate, onions, and peppermint. What increases the risk? Being overweight. Having a disease that affects your connective tissue. Taking NSAIDs, such a ibuprofen. What are the signs or symptoms? Heartburn. Difficult or painful swallowing. The feeling of having a lump in the throat. A bitter taste in the mouth. Bad breath. Having a lot of saliva. Having an upset or bloated stomach. Burping. Chest pain. Different conditions can cause chest pain. Make sure you see your doctor if you have chest pain. Shortness of breath or wheezing. A long-term cough or a cough at night. Wearing away of the surface of teeth (tooth enamel). Weight loss. How is this treated? Making changes to your diet. Taking medicine. Having surgery. Treatment will depend on how bad your symptoms are. Follow these instructions at home: Eating and drinking  Follow a  diet as told by your doctor. You may need to avoid foods and drinks such as: Coffee and tea, with or without caffeine. Drinks that contain alcohol. Energy drinks and sports drinks. Bubbly (carbonated) drinks or sodas. Chocolate and cocoa. Peppermint and mint flavorings. Garlic and onions. Horseradish. Spicy and acidic foods. These include peppers, chili powder, curry powder, vinegar, hot sauces, and BBQ sauce. Citrus fruit juices and citrus fruits, such as oranges, lemons, and limes. Tomato-based foods. These include red sauce, chili, salsa, and pizza with red sauce. Fried and fatty foods. These include donuts, french fries, potato chips, and high-fat dressings. High-fat meats. These include hot dogs, rib eye steak, sausage, ham, and bacon. High-fat dairy items, such as whole milk, butter, and cream cheese. Eat small meals often. Avoid eating large meals. Avoid drinking large amounts of liquid with your meals. Avoid eating meals during the 2-3 hours before bedtime. Avoid lying down right after you eat. Do not exercise right after you eat. Lifestyle  Do not smoke or use any products that contain nicotine or tobacco. If you need help quitting, ask your doctor. Try to lower your stress. If you need help doing this, ask your doctor. If you are overweight, lose an amount of weight that is healthy for you. Ask your doctor about a safe weight loss goal. General instructions Pay attention to any changes in your symptoms. Take over-the-counter and prescription medicines only as told by your doctor. Do not take aspirin, ibuprofen, or other NSAIDs unless your doctor says it is okay. Wear loose clothes. Do not wear anything tight around your waist. Raise (elevate) the head of your bed about 6  inches (15 cm). You may need to use a wedge to do this. Avoid bending over if this makes your symptoms worse. Keep all follow-up visits. Contact a doctor if: You have new symptoms. You lose weight and you  do not know why. You have trouble swallowing or it hurts to swallow. You have wheezing or a cough that keeps happening. You have a hoarse voice. Your symptoms do not get better with treatment. Get help right away if: You have sudden pain in your arms, neck, jaw, teeth, or back. You suddenly feel sweaty, dizzy, or light-headed. You have chest pain or shortness of breath. You vomit and the vomit is green, yellow, or black, or it looks like blood or coffee grounds. You faint. Your poop (stool) is red, bloody, or black. You cannot swallow, drink, or eat. These symptoms may represent a serious problem that is an emergency. Do not wait to see if the symptoms will go away. Get medical help right away. Call your local emergency services (911 in the [place] Do not drive yourself to the hospital. Summary If a person has gastroesophageal reflux disease (GERD), food and stomach acid move back up into the esophagus and cause symptoms or problems such as damage to the esophagus. Treatment will depend on how bad your symptoms are. Follow a diet as told by your doctor. Take all medicines only as told by your doctor. This information is not intended to replace advice given to you by your health care provider. Make sure you discuss any questions you have with your health care provider. Document Revised: 06/04/2020 Document Reviewed: 06/04/2020 Elsevier Patient Education  [place].

## 2021-09-23 ENCOUNTER — Other Ambulatory Visit: Payer: Self-pay

## 2021-09-23 ENCOUNTER — Encounter: Payer: Self-pay | Admitting: Gastroenterology

## 2021-09-23 NOTE — Progress Notes (Unsigned)
SENT FAX THROUGH COMMUNICATIONS

## 2021-09-24 ENCOUNTER — Telehealth: Payer: Self-pay

## 2021-09-24 DIAGNOSIS — R110 Nausea: Secondary | ICD-10-CM | POA: Diagnosis not present

## 2021-09-24 DIAGNOSIS — K219 Gastro-esophageal reflux disease without esophagitis: Secondary | ICD-10-CM | POA: Diagnosis not present

## 2021-09-24 DIAGNOSIS — K529 Noninfective gastroenteritis and colitis, unspecified: Secondary | ICD-10-CM | POA: Diagnosis not present

## 2021-09-24 NOTE — Telephone Encounter (Signed)
CALLED PATIENT ABOUT BLOOD THINNER SHE UNDERSTANDS WHEN TO STOP 2 DAYS PRIOR TO PROCEDURE AND RESTART AT GI DISCRETION IF NO BIOPSIES

## 2021-09-27 LAB — GI PROFILE, STOOL, PCR

## 2021-09-27 LAB — PANCREATIC ELASTASE, FECAL: Pancreatic Elastase, Fecal: 477 ug Elast./g (ref >200)

## 2021-10-11 DIAGNOSIS — Z1231 Encounter for screening mammogram for malignant neoplasm of breast: Secondary | ICD-10-CM | POA: Diagnosis not present

## 2021-10-17 ENCOUNTER — Other Ambulatory Visit: Payer: Self-pay

## 2021-10-17 ENCOUNTER — Other Ambulatory Visit: Payer: Self-pay | Admitting: Gastroenterology

## 2021-10-17 ENCOUNTER — Ambulatory Visit: Payer: 59 | Admitting: Anesthesiology

## 2021-10-17 ENCOUNTER — Ambulatory Visit
Admission: RE | Admit: 2021-10-17 | Discharge: 2021-10-17 | Disposition: A | Discharge status: Home / Self Care | Payer: 59 | Attending: Gastroenterology | Admitting: Gastroenterology

## 2021-10-17 ENCOUNTER — Encounter: Payer: Self-pay | Admitting: Gastroenterology

## 2021-10-17 ENCOUNTER — Encounter
Admission: RE | Disposition: A | Discharge status: Home / Self Care | Payer: Self-pay | Source: Home / Self Care | Attending: Gastroenterology

## 2021-10-17 DIAGNOSIS — K2100 Gastro-esophageal reflux disease with esophagitis, without bleeding: Secondary | ICD-10-CM | POA: Diagnosis not present

## 2021-10-17 DIAGNOSIS — K219 Gastro-esophageal reflux disease without esophagitis: Secondary | ICD-10-CM

## 2021-10-17 DIAGNOSIS — K529 Noninfective gastroenteritis and colitis, unspecified: Secondary | ICD-10-CM | POA: Diagnosis not present

## 2021-10-17 DIAGNOSIS — R110 Nausea: Secondary | ICD-10-CM

## 2021-10-17 DIAGNOSIS — K621 Rectal polyp: Secondary | ICD-10-CM | POA: Insufficient documentation

## 2021-10-17 DIAGNOSIS — K319 Disease of stomach and duodenum, unspecified: Secondary | ICD-10-CM | POA: Diagnosis not present

## 2021-10-17 DIAGNOSIS — K259 Gastric ulcer, unspecified as acute or chronic, without hemorrhage or perforation: Secondary | ICD-10-CM | POA: Diagnosis not present

## 2021-10-17 DIAGNOSIS — K2960 Other gastritis without bleeding: Secondary | ICD-10-CM

## 2021-10-17 DIAGNOSIS — D128 Benign neoplasm of rectum: Secondary | ICD-10-CM | POA: Diagnosis not present

## 2021-10-17 DIAGNOSIS — Z6841 Body Mass Index (BMI) 40.0 and over, adult: Secondary | ICD-10-CM | POA: Diagnosis not present

## 2021-10-17 DIAGNOSIS — K449 Diaphragmatic hernia without obstruction or gangrene: Secondary | ICD-10-CM | POA: Diagnosis not present

## 2021-10-17 DIAGNOSIS — Z86711 Personal history of pulmonary embolism: Secondary | ICD-10-CM | POA: Insufficient documentation

## 2021-10-17 DIAGNOSIS — K2970 Gastritis, unspecified, without bleeding: Secondary | ICD-10-CM | POA: Diagnosis not present

## 2021-10-17 DIAGNOSIS — K635 Polyp of colon: Secondary | ICD-10-CM | POA: Diagnosis not present

## 2021-10-17 DIAGNOSIS — R1110 Vomiting, unspecified: Secondary | ICD-10-CM | POA: Diagnosis not present

## 2021-10-17 HISTORY — PX: COLONOSCOPY WITH PROPOFOL: SHX5780

## 2021-10-17 HISTORY — PX: ESOPHAGOGASTRODUODENOSCOPY: SHX5428

## 2021-10-17 HISTORY — DX: Localized edema: R60.0

## 2021-10-17 LAB — POCT PREGNANCY, URINE: Preg Test, Ur: NEGATIVE

## 2021-10-17 SURGERY — COLONOSCOPY WITH PROPOFOL
Anesthesia: General

## 2021-10-17 MED ORDER — ONDANSETRON HCL 4 MG/2ML IJ SOLN
INTRAMUSCULAR | Status: DC | PRN
  Administered 2021-10-17: 4 mg via INTRAVENOUS

## 2021-10-17 MED ORDER — ESOMEPRAZOLE MAGNESIUM 40 MG PO CPDR
40.0000 mg | DELAYED_RELEASE_CAPSULE | Freq: Every day | ORAL | 3 refills | Status: DC
  Filled 2021-10-17: qty 30, 30d supply, fill #0
  Filled 2022-02-19: qty 30, 30d supply, fill #1
  Filled 2022-03-10: qty 30, 30d supply, fill #2
  Filled 2022-05-15: qty 30, 30d supply, fill #3

## 2021-10-17 MED ORDER — DEXMEDETOMIDINE HCL IN NACL 200 MCG/50ML IV SOLN
INTRAVENOUS | Status: DC | PRN
  Administered 2021-10-17: 12 ug via INTRAVENOUS
  Administered 2021-10-17: 4 ug via INTRAVENOUS

## 2021-10-17 MED ORDER — ONDANSETRON HCL 4 MG/2ML IJ SOLN
INTRAMUSCULAR | Status: AC
  Filled 2021-10-17: qty 2

## 2021-10-17 MED ORDER — LIDOCAINE HCL (CARDIAC) PF 100 MG/5ML IV SOSY
PREFILLED_SYRINGE | INTRAVENOUS | Status: DC | PRN
  Administered 2021-10-17: 50 mg via INTRAVENOUS

## 2021-10-17 MED ORDER — PROPOFOL 10 MG/ML IV BOLUS
INTRAVENOUS | Status: AC
  Filled 2021-10-17: qty 20

## 2021-10-17 MED ORDER — PROPOFOL 500 MG/50ML IV EMUL
INTRAVENOUS | Status: AC
  Filled 2021-10-17: qty 50

## 2021-10-17 MED ORDER — PROPOFOL 500 MG/50ML IV EMUL
INTRAVENOUS | Status: DC | PRN
  Administered 2021-10-17: 150 ug/kg/min via INTRAVENOUS

## 2021-10-17 MED ORDER — GLYCOPYRROLATE 0.2 MG/ML IJ SOLN
INTRAMUSCULAR | Status: DC | PRN
  Administered 2021-10-17: .2 mg via INTRAVENOUS

## 2021-10-17 MED ORDER — MIDAZOLAM HCL 2 MG/2ML IJ SOLN
INTRAMUSCULAR | Status: DC | PRN
  Administered 2021-10-17 (×2): 1 mg via INTRAVENOUS

## 2021-10-17 MED ORDER — GLYCOPYRROLATE 0.2 MG/ML IJ SOLN
INTRAMUSCULAR | Status: AC
  Filled 2021-10-17: qty 1

## 2021-10-17 MED ORDER — PROPOFOL 10 MG/ML IV BOLUS
INTRAVENOUS | Status: DC | PRN
  Administered 2021-10-17: 20 mg via INTRAVENOUS
  Administered 2021-10-17: 30 mg via INTRAVENOUS
  Administered 2021-10-17: 130 mg via INTRAVENOUS
  Administered 2021-10-17: 20 mg via INTRAVENOUS
  Administered 2021-10-17: 80 mg via INTRAVENOUS

## 2021-10-17 MED ORDER — DEXMEDETOMIDINE HCL IN NACL 200 MCG/50ML IV SOLN
INTRAVENOUS | Status: AC
  Filled 2021-10-17: qty 50

## 2021-10-17 MED ORDER — MIDAZOLAM HCL 2 MG/2ML IJ SOLN
INTRAMUSCULAR | Status: AC
  Filled 2021-10-17: qty 2

## 2021-10-17 MED ORDER — SODIUM CHLORIDE 0.9 % IV SOLN: INTRAVENOUS | Status: DC

## 2021-10-17 NOTE — Op Note (Signed)
[facility] Gastroenterology Patient Name: Courtney Hanson Procedure Date: 10/17/2021 8:14 AM MRN: [id number] Account #: [id number] Date of Birth: Apr 15, 1976 Admit Type: Outpatient Age: 45 Room: [facility] ENDO ROOM 4 Gender: Female Note Status: Finalized Instrument Name: Colonoscope [id number] Procedure:             Colonoscopy Indications:           This is the patient's first colonoscopy, Chronic                         diarrhea Providers:             Lin Landsman MD, MD Referring MD:          Romualdo Bolk. Aylward MD, MD (Referring MD) Medicines:             General Anesthesia Complications:         No immediate complications. Estimated blood loss: None. Procedure:             Pre-Anesthesia Assessment:                        - Prior to the procedure, a History and Physical was                         performed, and patient medications and allergies were                         reviewed. The patient is competent. The risks and                         benefits of the procedure and the sedation options and                         risks were discussed with the patient. All questions                         were answered and informed consent was obtained.                         Patient identification and proposed procedure were                         verified by the physician, the nurse, the                         anesthesiologist, the anesthetist and the technician                         in the pre-procedure area in the procedure room in the                         endoscopy suite. Mental Status Examination: alert and                         oriented. Airway Examination: normal oropharyngeal                         airway and neck mobility. Respiratory Examination:  clear to auscultation. CV Examination: normal.                         Prophylactic Antibiotics: The patient does not require                         prophylactic antibiotics. Prior  Anticoagulants: The                         patient has taken Eliquis (apixaban), last dose was 3                         days prior to procedure. ASA Grade Assessment: III - A                         patient with severe systemic disease. After reviewing                         the risks and benefits, the patient was deemed in                         satisfactory condition to undergo the procedure. The                         anesthesia plan was to use general anesthesia.                         Immediately prior to administration of medications,                         the patient was re-assessed for adequacy to receive                         sedatives. The heart rate, respiratory rate, oxygen                         saturations, blood pressure, adequacy of pulmonary                         ventilation, and response to care were monitored                         throughout the procedure. The physical status of the                         patient was re-assessed after the procedure.                        After obtaining informed consent, the colonoscope was                         passed under direct vision. Throughout the procedure,                         the patient's blood pressure, pulse, and oxygen                         saturations were monitored continuously. The  Colonoscope was introduced through the anus and                         advanced to the the terminal ileum, with                         identification of the appendiceal orifice and IC                         valve. The colonoscopy was performed without                         difficulty. The patient tolerated the procedure well.                         The quality of the bowel preparation was good. Findings:      The perianal and digital rectal examinations were normal. Pertinent       negatives include normal sphincter tone and no palpable rectal lesions.      The terminal ileum appeared  normal.      A 12 mm polyp was found in the rectum. The polyp was sessile. The polyp       was removed with a hot snare. Resection and retrieval were complete.       Estimated blood loss: none.      Normal mucosa was found in the entire colon. Biopsies were taken with a       cold forceps for histology.      The retroflexed view of the distal rectum and anal verge was normal and       showed no anal or rectal abnormalities. Impression:            - The examined portion of the ileum was normal.                        - One 12 mm polyp in the rectum, removed with a hot                         snare. Resected and retrieved.                        - Normal mucosa in the entire examined colon. Biopsied.                        - The distal rectum and anal verge are normal on                         retroflexion view. Recommendation:        - Discharge patient to home (with escort).                        - Resume previous diet today.                        - Continue present medications.                        - Await pathology results.                        -  Repeat colonoscopy in 3 years for surveillance.                        - Return to my office as previously scheduled. Procedure Code(s):     --- Professional ---                        [phone], Colonoscopy, flexible; with removal of                         tumor(s), polyp(s), or other lesion(s) by snare                         technique                        [id number], 71, Colonoscopy, flexible; with biopsy, single                         or multiple Diagnosis Code(s):     --- Professional ---                        K62.1, Rectal polyp                        K52.9, Noninfective gastroenteritis and colitis,                         unspecified CPT copyright 2019 American Medical Association. All rights reserved. The codes documented in this report are preliminary and upon coder review may  be revised to meet current compliance  requirements. Dr. Ulyess Mort Lin Landsman MD, MD 10/17/2021 9:06:17 AM This report has been signed electronically. Number of Addenda: 0 Note Initiated On: 10/17/2021 8:14 AM Scope Withdrawal Time: 0 hours 16 minutes 16 seconds  Total Procedure Duration: 0 hours 23 minutes 3 seconds  Estimated Blood Loss:  Estimated blood loss: none.      [facility]

## 2021-10-17 NOTE — Transfer of Care (Signed)
Immediate Anesthesia Transfer of Care Note  Patient: Courtney Hanson  Procedure(s) Performed: COLONOSCOPY WITH PROPOFOL ESOPHAGOGASTRODUODENOSCOPY (EGD)  Patient Location: PACU and Endoscopy Unit  Anesthesia Type:General  Level of Consciousness: drowsy and patient cooperative  Airway & Oxygen Therapy: Patient Spontanous Breathing  Post-op Assessment: Report given to RN and Post -op Vital signs reviewed and stable  Post vital signs: Reviewed and stable  Last Vitals:  Vitals Value Taken Time  BP 87/61 10/17/21 0911  Temp    Pulse 90 10/17/21 0913  Resp 15 10/17/21 0913  SpO2 98 % 10/17/21 0913  Vitals shown include unvalidated device data.  Last Pain:  Vitals:   10/17/21 0910  TempSrc:   PainSc: 0-No pain         Complications: No notable events documented.

## 2021-10-17 NOTE — Anesthesia Postprocedure Evaluation (Signed)
Anesthesia Post Note  Patient: RENNE CORNICK  Procedure(s) Performed: COLONOSCOPY WITH PROPOFOL ESOPHAGOGASTRODUODENOSCOPY (EGD)  Patient location during evaluation: Endoscopy Anesthesia Type: General Level of consciousness: awake and alert Pain management: pain level controlled Vital Signs Assessment: post-procedure vital signs reviewed and stable Respiratory status: spontaneous breathing, nonlabored ventilation, respiratory function stable and patient connected to nasal cannula oxygen Cardiovascular status: blood pressure returned to baseline and stable Postop Assessment: no apparent nausea or vomiting Anesthetic complications: no   No notable events documented.   Last Vitals:  Vitals:   10/17/21 0930 10/17/21 0940  BP: 109/77 108/76  Pulse: 88 82  Resp: 17 12  Temp:    SpO2: 99% 98%    Last Pain:  Vitals:   10/17/21 0940  TempSrc:   PainSc: 0-No pain                 Precious Haws Milta Croson

## 2021-10-17 NOTE — Op Note (Signed)
[facility] Patient Name: Courtney Hanson Procedure Date: 10/17/2021 8:15 AM MRN: [id number] Account #: [id number] Date of Birth: 12-03-1976 Admit Type: Outpatient Age: 45 Room: [facility] ENDO ROOM 4 Gender: Female Note Status: Finalized Instrument Name: Altamese Cabal Endoscope [id number] Procedure:             Upper GI endoscopy Indications:           Epigastric abdominal pain, Follow-up of                         gastro-esophageal reflux disease, Nausea with vomiting Providers:             Lin Landsman MD, MD Referring MD:          Romualdo Bolk. Aylward MD, MD (Referring MD) Medicines:             General Anesthesia Complications:         No immediate complications. Estimated blood loss: None. Procedure:             Pre-Anesthesia Assessment:                        - Prior to the procedure, a History and Physical was                         performed, and patient medications and allergies were                         reviewed. The patient is competent. The risks and                         benefits of the procedure and the sedation options and                         risks were discussed with the patient. All questions                         were answered and informed consent was obtained.                         Patient identification and proposed procedure were                         verified by the physician, the nurse, the                         anesthesiologist, the anesthetist and the technician                         in the pre-procedure area in the procedure room in the                         endoscopy suite. Mental Status Examination: alert and                         oriented. Airway Examination: normal oropharyngeal                         airway and neck mobility. Respiratory Examination:  clear to auscultation. CV Examination: normal.                         Prophylactic Antibiotics: The patient does not require                          prophylactic antibiotics. Prior Anticoagulants: The                         patient has taken no previous anticoagulant or                         antiplatelet agents. ASA Grade Assessment: III - A                         patient with severe systemic disease. After reviewing                         the risks and benefits, the patient was deemed in                         satisfactory condition to undergo the procedure. The                         anesthesia plan was to use general anesthesia.                         Immediately prior to administration of medications,                         the patient was re-assessed for adequacy to receive                         sedatives. The heart rate, respiratory rate, oxygen                         saturations, blood pressure, adequacy of pulmonary                         ventilation, and response to care were monitored                         throughout the procedure. The physical status of the                         patient was re-assessed after the procedure.                        After obtaining informed consent, the endoscope was                         passed under direct vision. Throughout the procedure,                         the patient's blood pressure, pulse, and oxygen                         saturations were monitored continuously. The Endoscope  was introduced through the mouth, and advanced to the                         second part of duodenum. The upper GI endoscopy was                         accomplished without difficulty. The patient tolerated                         the procedure well. Findings:      The duodenal bulb and second portion of the duodenum were normal.      Scattered mild inflammation characterized by erosions was found in the       gastric antrum. Biopsies were taken with a cold forceps for Helicobacter       pylori testing.      The gastric body was normal. Biopsies were  taken with a cold forceps for       Helicobacter pylori testing.      The cardia and gastric fundus were normal on retroflexion.      A small hiatal hernia was present.      The Z-line was regular and was found 36 cm from the incisors.      The examined esophagus was normal. Impression:            - Normal duodenal bulb and second portion of the                         duodenum.                        - Erosive gastritis. Biopsied.                        - Normal gastric body. Biopsied.                        - Small hiatal hernia.                        - Z-line regular, 36 cm from the incisors.                        - Normal esophagus. Recommendation:        - Await pathology results.                        - Follow an antireflux regimen.                        - Use a proton pump inhibitor PO BID for 3 months.                        - Proceed with colonoscopy as scheduled                        See colonoscopy report Procedure Code(s):     --- Professional ---                        [phone], Esophagogastroduodenoscopy, flexible,  transoral; with biopsy, single or multiple Diagnosis Code(s):     --- Professional ---                        K44.9, Diaphragmatic hernia without obstruction or                         gangrene                        R10.13, Epigastric pain                        K29.60, Other gastritis without bleeding                        K21.9, Gastro-esophageal reflux disease without                         esophagitis                        R11.2, Nausea with vomiting, unspecified CPT copyright 2019 American Medical Association. All rights reserved. The codes documented in this report are preliminary and upon coder review may  be revised to meet current compliance requirements. Dr. Ulyess Mort Lin Landsman MD, MD 10/17/2021 8:38:27 AM This report has been signed electronically. Number of Addenda: 0 Note Initiated On: 10/17/2021 8:15  AM Estimated Blood Loss:  Estimated blood loss: none.      [facility]

## 2021-10-17 NOTE — Anesthesia Preprocedure Evaluation (Signed)
Anesthesia Evaluation  Patient identified by MRN, date of birth, ID band Patient awake    Reviewed: Allergy & Precautions, NPO status , Patient's Chart, lab work & pertinent test results  History of Anesthesia Complications Negative for: history of anesthetic complications  Airway Mallampati: III  TM Distance: >3 FB Neck ROM: full    Dental  (+) Chipped   Pulmonary neg pulmonary ROS, neg shortness of breath,    Pulmonary exam normal        Cardiovascular (-) anginanegative cardio ROS Normal cardiovascular exam     Neuro/Psych negative neurological ROS  negative psych ROS   GI/Hepatic negative GI ROS, Neg liver ROS, neg GERD  ,  Endo/Other  negative endocrine ROS  Renal/GU negative Renal ROS  negative genitourinary   Musculoskeletal   Abdominal   Peds  Hematology negative hematology ROS (+)   Anesthesia Other Findings Past Medical History: No date: Lower extremity edema No date: Pulmonary embolism (HCC)  Past Surgical History: No date: NO PAST SURGERIES  BMI    Body Mass Index: 62.35 kg/m      Reproductive/Obstetrics negative OB ROS                             Anesthesia Physical Anesthesia Plan  ASA: 3  Anesthesia Plan: General   Post-op Pain Management:    Induction: Intravenous  PONV Risk Score and Plan: Propofol infusion and TIVA  Airway Management Planned: Natural Airway and Nasal Cannula  Additional Equipment:   Intra-op Plan:   Post-operative Plan:   Informed Consent: I have reviewed the patients History and Physical, chart, labs and discussed the procedure including the risks, benefits and alternatives for the proposed anesthesia with the patient or authorized representative who has indicated his/her understanding and acceptance.     Dental Advisory Given  Plan Discussed with: Anesthesiologist, CRNA and Surgeon  Anesthesia Plan Comments: (Patient  consented for risks of anesthesia including but not limited to:  - adverse reactions to medications - risk of airway placement if required - damage to eyes, teeth, lips or other oral mucosa - nerve damage due to positioning  - sore throat or hoarseness - Damage to heart, brain, nerves, lungs, other parts of body or loss of life  Patient voiced understanding.)        Anesthesia Quick Evaluation

## 2021-10-17 NOTE — H&P (Signed)
Cephas Darby, MD [address]  [place]  [place], [place] [address]  Main: [phone]  Fax: [phone] Pager: [phone]  Primary Care Physician:  Dola Argyle, MD Primary Gastroenterologist:  Dr. Cephas Darby  Pre-Procedure History & Physical: HPI:  Courtney Hanson is a 45 y.o. female is here for an endoscopy and colonoscopy.   Past Medical History:  Diagnosis Date   Lower extremity edema    Pulmonary embolism (HCC)     Past Surgical History:  Procedure Laterality Date   NO PAST SURGERIES      Prior to Admission medications   Medication Sig Start Date End Date Taking? Authorizing Provider  FLUoxetine (PROZAC) 20 MG tablet Take 20 mg by mouth daily.   Yes [provider]  potassium chloride (MICRO-K) 10 MEQ CR capsule Take by mouth. 10/19/20 10/17/21 Yes [provider]  promethazine (PHENERGAN) 25 MG tablet Takes as needed 09/30/20  Yes [provider]  albuterol (VENTOLIN HFA) 108 (90 Base) MCG/ACT inhaler Inhale into the lungs. 12/28/18   [provider]  ALPRAZolam Duanne Moron) 0.5 MG tablet Take by mouth. 06/16/19   [provider]  apixaban (ELIQUIS) 5 MG TABS tablet TAKE 1 TABLET BY MOUTH TWICE DAILY 12/06/20 12/06/21  Edwyna Ready Anne Cutchis, PA-C  clonazePAM (KLONOPIN) 1 MG tablet Take by mouth.    [provider]  COVID-19 At Home Antigen Test [facility] COVID-19 HOME TEST) KIT use as directed 07/16/21   Letta Median, RPH  cyclobenzaprine (FLEXERIL) 5 MG tablet Take by mouth.    [provider]  fluconazole (DIFLUCAN) 150 MG tablet Take one tablet (150 mg dose) by mouth once for 1 dose. Take one tablet by mouth as a single dose. Patient not taking: Reported on 10/17/2021 08/05/21     furosemide (LASIX) 20 MG tablet Take by mouth. 10/19/20 10/19/21  [provider]  LORazepam (ATIVAN) 0.5 MG tablet Take by mouth.    [provider]  nitrofurantoin,  macrocrystal-monohydrate, (MACROBID) 100 MG capsule Take one capsule (100 mg dose) by mouth 2 (two) times daily for 7 days. Patient not taking: Reported on 09/18/2021 08/05/21     topiramate (TOPAMAX) 25 MG tablet Take by mouth. Patient not taking: Reported on 10/17/2021    [provider]  zolpidem (AMBIEN CR) 6.25 MG CR tablet TAKE 1 TABLET BY MOUTH NIGHTLY AS NEEDED FOR SLEEP 01/08/21 07/07/21  Dola Argyle, MD    Allergies as of 09/18/2021 - Review Complete 09/18/2021  Allergen Reaction Noted   Tetanus toxoid Dermatitis, Hives, Itching, and Rash 07/13/2015   Topiramate  02/08/2021   Venlafaxine  01/06/2020    Family History  Problem Relation Age of Onset   Hypertension Mother    Diabetes Mother    Hypertension Father     Social History   Socioeconomic History   Marital status: Single    Spouse name: Not on file   Number of children: Not on file   Years of education: Not on file   Highest education level: Not on file  Occupational History   Not on file  Tobacco Use   Smoking status: Never   Smokeless tobacco: Never  Vaping Use   Vaping Use: Never used  Substance and Sexual Activity   Alcohol use: Not Currently   Drug use: Not Currently   Sexual activity: Not on file  Other Topics Concern   Not on file  Social History Narrative   Not on file   Social Determinants  of Health   Financial Resource Strain: Not on file  Food Insecurity: Not on file  Transportation Needs: Not on file  Physical Activity: Not on file  Stress: Not on file  Social Connections: Not on file  Intimate Partner Violence: Not on file    Review of Systems: See HPI, otherwise negative ROS  Physical Exam: BP (!) 145/84   Pulse 97   Temp (!) 97.2 F (36.2 C) (Temporal)   Resp 16   Ht 5' 1"  (1.549 m)   Wt (!) 149.7 kg   LMP 10/04/2021 (Approximate)   SpO2 99%   BMI 62.35 kg/m  General:   Alert,  pleasant and cooperative in NAD Head:  Normocephalic and atraumatic. Neck:   Supple; no masses or thyromegaly. Lungs:  Clear throughout to auscultation.    Heart:  Regular rate and rhythm. Abdomen:  Soft, nontender and nondistended. Normal bowel sounds, without guarding, and without rebound.   Neurologic:  Alert and  oriented x4;  grossly normal neurologically.  Impression/Plan: Courtney Hanson is here for an endoscopy and colonoscopy to be performed for chronic gerd and chronic non bloody diarrhea  Risks, benefits, limitations, and alternatives regarding  endoscopy and colonoscopy have been reviewed with the patient.  Questions have been answered.  All parties agreeable.   Sherri Sear, MD  10/17/2021, 7:45 AM

## 2021-10-18 ENCOUNTER — Encounter: Payer: Self-pay | Admitting: Gastroenterology

## 2021-10-18 DIAGNOSIS — G5603 Carpal tunnel syndrome, bilateral upper limbs: Secondary | ICD-10-CM | POA: Diagnosis not present

## 2021-10-18 DIAGNOSIS — M7711 Lateral epicondylitis, right elbow: Secondary | ICD-10-CM | POA: Diagnosis not present

## 2021-10-18 LAB — SURGICAL PATHOLOGY

## 2021-10-27 MED FILL — Apixaban Tab 5 MG: ORAL | 30 days supply | Qty: 60 | Fill #7 | Status: AC

## 2021-10-28 ENCOUNTER — Other Ambulatory Visit: Payer: Self-pay

## 2021-10-28 MED ORDER — FLUOXETINE HCL 40 MG PO CAPS
40.0000 mg | ORAL_CAPSULE | Freq: Every day | ORAL | 0 refills | Status: DC
  Filled 2021-10-28: qty 90, 90d supply, fill #0

## 2021-10-30 ENCOUNTER — Ambulatory Visit
Admission: EM | Admit: 2021-10-30 | Discharge: 2021-10-30 | Disposition: A | Discharge status: Home / Self Care | Payer: 59 | Attending: Emergency Medicine | Admitting: Emergency Medicine

## 2021-10-30 ENCOUNTER — Other Ambulatory Visit: Payer: Self-pay

## 2021-10-30 ENCOUNTER — Encounter: Payer: Self-pay | Admitting: Emergency Medicine

## 2021-10-30 DIAGNOSIS — N760 Acute vaginitis: Secondary | ICD-10-CM | POA: Diagnosis not present

## 2021-10-30 DIAGNOSIS — B9689 Other specified bacterial agents as the cause of diseases classified elsewhere: Secondary | ICD-10-CM | POA: Insufficient documentation

## 2021-10-30 DIAGNOSIS — Z113 Encounter for screening for infections with a predominantly sexual mode of transmission: Secondary | ICD-10-CM | POA: Insufficient documentation

## 2021-10-30 LAB — WET PREP, GENITAL
Sperm: NONE SEEN
Trich, Wet Prep: NONE SEEN
WBC, Wet Prep HPF POC: <10 — AB (ref <10)
Yeast Wet Prep HPF POC: NONE SEEN

## 2021-10-30 LAB — CHLAMYDIA/NGC RT PCR (ARMC ONLY)
Chlamydia Tr: NOT DETECTED
N gonorrhoeae: NOT DETECTED

## 2021-10-30 MED ORDER — METRONIDAZOLE 500 MG PO TABS: 500.0000 mg | ORAL_TABLET | Freq: Two times a day (BID) | ORAL | 0 refills | Status: DC

## 2021-10-30 MED ORDER — PROMETHAZINE HCL 25 MG PO TABS
ORAL_TABLET | ORAL | 2 refills | Status: DC
  Filled 2021-10-30: qty 30, 7d supply, fill #0
  Filled 2022-05-15: qty 30, 7d supply, fill #1
  Filled 2022-06-18: qty 30, 7d supply, fill #2

## 2021-10-30 MED ORDER — FLUCONAZOLE 150 MG PO TABS: ORAL_TABLET | ORAL | 0 refills | Status: DC

## 2021-10-30 NOTE — ED Provider Notes (Signed)
[facility]    CSN: [id number] Arrival date & time: 10/30/21  1812      History   Chief Complaint Chief Complaint  Patient presents with   Exposure to STD    HPI Courtney Hanson is a 45 y.o. female who presents today for STD screening.  The patient denies any previous history of STD.  The patient does report a new sexual encounter on Saturday.  The patient has had the Essure implant.  She states that the female partner did not wear a condom.  She is unsure of his STD status.  She denies any symptoms.  She denies any low back pain.  She denies any abdominal pain.  No vaginal discharge or foul odor.  No urinary symptoms.  She presents today as a precaution.   Exposure to STD   Past Medical History:  Diagnosis Date   Lower extremity edema    Pulmonary embolism [facility])     Patient Active Problem List   Diagnosis Date Noted   Chronic diarrhea    Adenomatous rectal polyp    Nausea without vomiting    Chronic GERD    Erosive gastritis    Prediabetes 10/18/2020   Chronic right-sided thoracic back pain 09/23/2019   Vitamin D deficiency 06/04/2017   Deep venous thrombosis of lower extremity ([place]) 06/19/2016   Tachycardia 06/19/2016   Major depressive disorder, recurrent episode, moderate ([place]) 07/13/2015   PMDD (premenstrual dysphoric disorder) 12/21/2013   GAD (generalized anxiety disorder) 06/24/2013   Morbid obesity with BMI of 60.0-69.9, adult ([place]) 06/24/2013   Endometritis 05/31/2013    Past Surgical History:  Procedure Laterality Date   COLONOSCOPY WITH PROPOFOL N/A 10/17/2021   Procedure: COLONOSCOPY WITH PROPOFOL;  Surgeon: Lin Landsman, MD;  Location: [facility] ENDOSCOPY;  Service: Gastroenterology;  Laterality: N/A;   ESOPHAGOGASTRODUODENOSCOPY N/A 10/17/2021   Procedure: ESOPHAGOGASTRODUODENOSCOPY (EGD);  Surgeon: Lin Landsman, MD;  Location: [facility] ENDOSCOPY;  Service: Gastroenterology;  Laterality: N/A;   NO PAST SURGERIES      OB History    No obstetric history on file.      Home Medications    Prior to Admission medications   Medication Sig Start Date End Date Taking? Authorizing Provider  apixaban (ELIQUIS) 5 MG TABS tablet TAKE 1 TABLET BY MOUTH TWICE DAILY 12/06/20 12/06/21 Yes Pilar Plate, Gibson Cutchis, PA-C  clonazePAM (KLONOPIN) 1 MG tablet Take by mouth.   Yes [provider]  cyclobenzaprine (FLEXERIL) 5 MG tablet Take by mouth.   Yes [provider]  esomeprazole (NEXIUM) 40 MG capsule Take 1 capsule (40 mg total) by mouth daily before breakfast. 10/17/21 11/17/21 Yes Vanga, Tally Due, MD  fluconazole (DIFLUCAN) 150 MG tablet Take 1 tablet today and second tablet 3 days later. 10/30/21  Yes Lattie Corns, PA-C  FLUoxetine (PROZAC) 40 MG capsule Take 1 capsule (40 mg total) by mouth daily. 10/28/21  Yes   furosemide (LASIX) 20 MG tablet Take by mouth. 10/19/20 10/30/21 Yes [provider]  metroNIDAZOLE (FLAGYL) 500 MG tablet Take 1 tablet (500 mg total) by mouth 2 (two) times daily. 10/30/21  Yes Lattie Corns, PA-C  potassium chloride (MICRO-K) 10 MEQ CR capsule Take by mouth. 10/19/20 10/30/21 Yes [provider]  promethazine (PHENERGAN) 25 MG tablet Takes as needed 09/30/20  Yes [provider]  zolpidem (AMBIEN CR) 6.25 MG CR tablet TAKE 1 TABLET BY MOUTH NIGHTLY AS NEEDED FOR SLEEP 01/08/21 10/30/21 Yes Dola Argyle, MD  ALPRAZolam Duanne Moron)  0.5 MG tablet Take by mouth. 06/16/19   [provider]  FLUoxetine (PROZAC) 20 MG tablet Take 20 mg by mouth daily.    [provider]  promethazine (PHENERGAN) 25 MG tablet Take 1 tablet (25 mg total) by mouth every six (6) hours as needed for nausea. 10/29/21       Family History Family History  Problem Relation Age of Onset   Hypertension Mother    Diabetes Mother    Hypertension Father     Social History Social History   Tobacco Use   Smoking status: Never   Smokeless  tobacco: Never  Vaping Use   Vaping Use: Never used  Substance Use Topics   Alcohol use: Not Currently   Drug use: Not Currently     Allergies   Tetanus toxoid, Topiramate, and Venlafaxine   Review of Systems Review of Systems  Genitourinary:  Negative for difficulty urinating, pelvic pain, vaginal bleeding, vaginal discharge and vaginal pain.  All other systems reviewed and are negative.   Physical Exam Triage Vital Signs ED Triage Vitals  Enc Vitals Group     BP 10/30/21 1859 123/86     Pulse Rate 10/30/21 1859 88     Resp 10/30/21 1859 18     Temp 10/30/21 1859 98.1 F (36.7 C)     Temp Source 10/30/21 1859 Oral     SpO2 10/30/21 1859 99 %     Weight 10/30/21 1856 (!) 330 lb 0.5 oz (149.7 kg)     Height 10/30/21 1856 5\' 1"  (1.549 m)     Head Circumference --      Peak Flow --      Pain Score 10/30/21 1856 0     Pain Loc --      Pain Edu? --      Excl. in [place]? --    No data found.  Updated Vital Signs BP 123/86 (BP Location: Left Arm)   Pulse 88   Temp 98.1 F (36.7 C) (Oral)   Resp 18   Ht 5\' 1"  (1.549 m)   Wt (!) 330 lb 0.5 oz (149.7 kg)   LMP 10/04/2021 (Approximate)   SpO2 99%   BMI 62.36 kg/m   Visual Acuity Right Eye Distance:   Left Eye Distance:   Bilateral Distance:    Right Eye Near:   Left Eye Near:    Bilateral Near:     Physical Exam Vitals reviewed.  Constitutional:      General: She is not in acute distress.    Appearance: She is not ill-appearing, toxic-appearing or diaphoretic.  Cardiovascular:     Rate and Rhythm: Normal rate and regular rhythm.     Heart sounds: No murmur heard.   No friction rub. No gallop.  Pulmonary:     Effort: Pulmonary effort is normal.     Breath sounds: Normal breath sounds. No stridor. No wheezing.  Abdominal:     General: Abdomen is flat. Bowel sounds are normal.     Palpations: Abdomen is soft.  Neurological:     Mental Status: She is alert.   UC Treatments / Results  Labs (all labs  ordered are listed, but only abnormal results are displayed) Labs Reviewed  WET PREP, GENITAL - Abnormal; Notable for the following components:      Result Value   Clue Cells Wet Prep HPF POC PRESENT (*)    WBC, Wet Prep HPF POC <10 (*)    All other components within normal  limits  CHLAMYDIA/NGC RT PCR [facility] ONLY)              EKG   Radiology No results found.  Procedures Procedures (including critical care time)  Medications Ordered in UC Medications - No data to display  Initial Impression / Assessment and Plan / UC Course  I have reviewed the triage vital signs and the nursing notes.  Pertinent labs & imaging results that were available during my care of the patient were reviewed by me and considered in my medical decision making (see chart for details).     1.  Treatment options were discussed today with the patient. 2.  Wet prep performed today, clue cells present indicative of underlying bacterial vaginosis. 3.  Treated with Flagyl for 7 days.  Diflucan also prescribed. 4.  The patient will contact clinic later this week for results of the gonorrhea and Chlamydia testing. 5.  Follow-up on as-needed basis. Final Clinical Impressions(s) / UC Diagnoses   Final diagnoses:  Screen for STD (sexually transmitted disease)  Bacterial vaginosis     Discharge Instructions      -Contact this department in the next several days for results. -Continue activities as tolerated.   ED Prescriptions     Medication Sig Dispense Auth. Provider   metroNIDAZOLE (FLAGYL) 500 MG tablet Take 1 tablet (500 mg total) by mouth 2 (two) times daily. 14 tablet Lattie Corns, PA-C   fluconazole (DIFLUCAN) 150 MG tablet Take 1 tablet today and second tablet 3 days later. 2 tablet Lattie Corns, PA-C      PDMP not reviewed this encounter.   Lattie Corns, [place] 10/30/21 [phone]

## 2021-10-30 NOTE — ED Triage Notes (Signed)
Pt presents to [facility] for std screening. Denies symptoms or contact with anyone with an std.

## 2021-10-30 NOTE — Discharge Instructions (Addendum)
-  Contact this department in the next several days for results. -Continue activities as tolerated.

## 2021-11-05 ENCOUNTER — Telehealth: Payer: 59 | Admitting: Physician Assistant

## 2021-11-05 DIAGNOSIS — H66002 Acute suppurative otitis media without spontaneous rupture of ear drum, left ear: Secondary | ICD-10-CM | POA: Diagnosis not present

## 2021-11-05 MED ORDER — CIPROFLOXACIN-DEXAMETHASONE 0.3-0.1 % OT SUSP
4.0000 [drp] | Freq: Two times a day (BID) | OTIC | 0 refills | Status: DC
  Filled 2021-11-06: qty 7.5, 10d supply, fill #0

## 2021-11-05 NOTE — Patient Instructions (Signed)
Courtney Hanson, thank you for joining Mar Daring, PA-C for today's virtual visit.  While this provider is not your primary care provider (PCP), if your PCP is located in our provider database this encounter information will be shared with them immediately following your visit.  Consent: (Patient) Courtney Hanson provided verbal consent for this virtual visit at the beginning of the encounter.  Current Medications:  Current Outpatient Medications:    ciprofloxacin-dexamethasone (CIPRODEX) OTIC suspension, Place 4 drops into the left ear 2 (two) times daily. X 5-7 days, Disp: 7.5 mL, Rfl: 0   ALPRAZolam (XANAX) 0.5 MG tablet, Take by mouth., Disp: , Rfl:    apixaban (ELIQUIS) 5 MG TABS tablet, TAKE 1 TABLET BY MOUTH TWICE DAILY, Disp: 60 tablet, Rfl: 10   clonazePAM (KLONOPIN) 1 MG tablet, Take by mouth., Disp: , Rfl:    cyclobenzaprine (FLEXERIL) 5 MG tablet, Take by mouth., Disp: , Rfl:    esomeprazole (NEXIUM) 40 MG capsule, Take 1 capsule (40 mg total) by mouth daily before breakfast., Disp: 30 capsule, Rfl: 3   fluconazole (DIFLUCAN) 150 MG tablet, Take 1 tablet today and second tablet 3 days later., Disp: 2 tablet, Rfl: 0   FLUoxetine (PROZAC) 20 MG tablet, Take 20 mg by mouth daily., Disp: , Rfl:    FLUoxetine (PROZAC) 40 MG capsule, Take 1 capsule (40 mg total) by mouth daily., Disp: 90 capsule, Rfl: 0   furosemide (LASIX) 20 MG tablet, Take by mouth., Disp: , Rfl:    metroNIDAZOLE (FLAGYL) 500 MG tablet, Take 1 tablet (500 mg total) by mouth 2 (two) times daily., Disp: 14 tablet, Rfl: 0   potassium chloride (MICRO-K) 10 MEQ CR capsule, Take by mouth., Disp: , Rfl:    promethazine (PHENERGAN) 25 MG tablet, Takes as needed, Disp: , Rfl:    promethazine (PHENERGAN) 25 MG tablet, Take 1 tablet (25 mg total) by mouth every six (6) hours as needed for nausea., Disp: 30 tablet, Rfl: 2   zolpidem (AMBIEN CR) 6.25 MG CR tablet, TAKE 1 TABLET BY MOUTH NIGHTLY AS NEEDED FOR SLEEP, Disp:  30 tablet, Rfl: 0   Medications ordered in this encounter:  Meds ordered this encounter  Medications   ciprofloxacin-dexamethasone (CIPRODEX) OTIC suspension    Sig: Place 4 drops into the left ear 2 (two) times daily. X 5-7 days    Dispense:  7.5 mL    Refill:  0    Order Specific Question:   Supervising Provider    Answer:   Sabra Heck, BRIAN [[id number]]     *If you need refills on other medications prior to your next appointment, please contact your pharmacy*  Follow-Up: Call back or seek an in-person evaluation if the symptoms worsen or if the condition fails to improve as anticipated.  Other Instructions Otitis Media, Adult Otitis media occurs when there is inflammation and fluid in the middle ear with signs and symptoms of an acute infection. The middle ear is a part of the ear that contains bones for hearing as well as air that helps send sounds to the brain. When infected fluid builds up in this space, it causes pressure and can lead to an ear infection. The eustachian tube connects the middle ear to the back of the nose (nasopharynx) and normally allows air into the middle ear. If the eustachian tube becomes blocked, fluid can build up and become infected. What are the causes? This condition is caused by a blockage in the eustachian tube. This can be  caused by mucus or by swelling of the tube. Problems that can cause a blockage include: A cold or other upper respiratory infection. Allergies. An irritant, such as tobacco smoke. Enlarged adenoids. The adenoids are areas of soft tissue located high in the back of the throat, behind the nose and the roof of the mouth. They are part of the body's defense system (immune system). A mass in the nasopharynx. Damage to the ear caused by pressure changes (barotrauma). What increases the risk? You are more likely to develop this condition if you: Smoke or are exposed to tobacco smoke. Have an opening in the roof of your mouth (cleft  palate). Have gastroesophageal reflux. Have an immune system disorder. What are the signs or symptoms? Symptoms of this condition include: Ear pain. Fever. Decreased hearing. Tiredness (lethargy). Fluid leaking from the ear, if the eardrum is ruptured or has burst. Ringing in the ear. How is this diagnosed? This condition is diagnosed with a physical exam. During the exam, your health care provider will use an instrument called an otoscope to look in your ear and check for redness, swelling, and fluid. He or she will also ask about your symptoms. Your health care provider may also order tests, such as: A pneumatic otoscopy. This is a test to check the movement of the eardrum. It is done by squeezing a small amount of air into the ear. A tympanogram. This is a test that shows how well the eardrum moves in response to air pressure in the ear canal. It provides a graph for your health care provider to review. How is this treated? This condition can go away on its own within 3-5 days. But if the condition is caused by a bacterial infection and does not go away on its own, or if it keeps coming back, your health care provider may: Prescribe antibiotic medicine to treat the infection. Prescribe or recommend medicines to control pain. Follow these instructions at home: Take over-the-counter and prescription medicines only as told by your health care provider. If you were prescribed an antibiotic medicine, take it as told by your health care provider. Do not stop taking the antibiotic even if you start to feel better. Keep all follow-up visits. This is important. Contact a health care provider if: You have bleeding from your nose. There is a lump on your neck. You are not feeling better in 5 days. You feel worse instead of better. Get help right away if: You have severe pain that is not controlled with medicine. You have swelling, redness, or pain around your ear. You have stiffness in your  neck. A part of your face is not moving (paralyzed). The bone behind your ear (mastoid bone) is tender when you touch it. You develop a severe headache. Summary Otitis media is redness, soreness, and swelling of the middle ear, usually resulting in pain and decreased hearing. This condition can go away on its own within 3-5 days. If the problem does not go away in 3-5 days, your health care provider may give you medicines to treat the infection. If you were prescribed an antibiotic medicine, take it as told by your health care provider. Follow all instructions that were given to you by your health care provider. This information is not intended to replace advice given to you by your health care provider. Make sure you discuss any questions you have with your health care provider. Document Revised: 03/04/2021 Document Reviewed: 03/04/2021 Elsevier Patient Education  [place].  If you have been instructed to have an in-person evaluation today at a local Urgent Care facility, please use the link below. It will take you to a list of all of our available [facility], including address, phone number and hours of operation. Please do not delay care.  [facility]  If you or a family member do not have a primary care provider, use the link below to schedule a visit and establish care. When you choose a [facility] primary care physician or advanced practice provider, you gain a long-term partner in health. Find a Primary Care Provider  Learn more about [facility]'s in-office and virtual care options: [facility]

## 2021-11-05 NOTE — Progress Notes (Signed)
Virtual Visit Consent   Courtney Hanson, you are scheduled for a virtual visit with a [facility] provider today.     Just as with appointments in the office, your consent must be obtained to participate.  Your consent will be active for this visit and any virtual visit you may have with one of our providers in the next 365 days.     If you have a MyChart account, a copy of this consent can be sent to you electronically.  All virtual visits are billed to your insurance company just like a traditional visit in the office.    As this is a virtual visit, video technology does not allow for your provider to perform a traditional examination.  This may limit your provider's ability to fully assess your condition.  If your provider identifies any concerns that need to be evaluated in person or the need to arrange testing (such as labs, EKG, etc.), we will make arrangements to do so.     Although advances in technology are sophisticated, we cannot ensure that it will always work on either your end or our end.  If the connection with a video visit is poor, the visit may have to be switched to a telephone visit.  With either a video or telephone visit, we are not always able to ensure that we have a secure connection.     I need to obtain your verbal consent now.   Are you willing to proceed with your visit today?    Courtney Hanson has provided verbal consent on 11/05/2021 for a virtual visit (video or telephone).   Mar Daring, PA-C   Date: 11/05/2021 7:38 PM   Virtual Visit via Video Note   I, Mar Daring, connected with  Courtney Hanson  ([id number], 08-Dec-1976) on 11/05/21 at  7:30 PM EST by a video-enabled telemedicine application and verified that I am speaking with the correct person using two identifiers.  Location: Patient: Virtual Visit Location Patient: Mobile Provider: Virtual Visit Location Provider: Home Office   I discussed the limitations of evaluation and management  by telemedicine and the availability of in person appointments. The patient expressed understanding and agreed to proceed.    History of Present Illness: Courtney Hanson is a 45 y.o. who identifies as a female who was assigned female at birth, and is being seen today for ear pain.  HPI: Otalgia  There is pain in the left ear. This is a new problem. The current episode started in the past 7 days (Sunday). The problem occurs constantly. The problem has been gradually worsening. There has been no fever. The pain is moderate. Pertinent negatives include no coughing, diarrhea, ear discharge, headaches, hearing loss, neck pain, rhinorrhea, sore throat or vomiting. Associated symptoms comments: Mild dizziness and loss of appetite. She has tried nothing for the symptoms. The treatment provided no relief. There is no history of a chronic ear infection or a tympanostomy tube.     Problems:  Patient Active Problem List   Diagnosis Date Noted   Chronic diarrhea    Adenomatous rectal polyp    Nausea without vomiting    Chronic GERD    Erosive gastritis    Prediabetes 10/18/2020   Chronic right-sided thoracic back pain 09/23/2019   Vitamin D deficiency 06/04/2017   Deep venous thrombosis of lower extremity ([place]) 06/19/2016   Tachycardia 06/19/2016   Major depressive disorder, recurrent episode, moderate ([place]) 07/13/2015   PMDD (premenstrual dysphoric  disorder) 12/21/2013   GAD (generalized anxiety disorder) 06/24/2013   Morbid obesity with BMI of 60.0-69.9, adult ([place]) 06/24/2013   Endometritis 05/31/2013    Allergies:  Allergies  Allergen Reactions   Tetanus Toxoid Dermatitis, Hives, Itching and Rash   Topiramate     Shaking hands & whoozy   Venlafaxine     Elevation of blood pressure   Medications:  Current Outpatient Medications:    ciprofloxacin-dexamethasone (CIPRODEX) OTIC suspension, Place 4 drops into the left ear 2 (two) times daily. X 5-7 days, Disp: 7.5 mL, Rfl: 0   ALPRAZolam  (XANAX) 0.5 MG tablet, Take by mouth., Disp: , Rfl:    apixaban (ELIQUIS) 5 MG TABS tablet, TAKE 1 TABLET BY MOUTH TWICE DAILY, Disp: 60 tablet, Rfl: 10   clonazePAM (KLONOPIN) 1 MG tablet, Take by mouth., Disp: , Rfl:    cyclobenzaprine (FLEXERIL) 5 MG tablet, Take by mouth., Disp: , Rfl:    esomeprazole (NEXIUM) 40 MG capsule, Take 1 capsule (40 mg total) by mouth daily before breakfast., Disp: 30 capsule, Rfl: 3   fluconazole (DIFLUCAN) 150 MG tablet, Take 1 tablet today and second tablet 3 days later., Disp: 2 tablet, Rfl: 0   FLUoxetine (PROZAC) 20 MG tablet, Take 20 mg by mouth daily., Disp: , Rfl:    FLUoxetine (PROZAC) 40 MG capsule, Take 1 capsule (40 mg total) by mouth daily., Disp: 90 capsule, Rfl: 0   furosemide (LASIX) 20 MG tablet, Take by mouth., Disp: , Rfl:    metroNIDAZOLE (FLAGYL) 500 MG tablet, Take 1 tablet (500 mg total) by mouth 2 (two) times daily., Disp: 14 tablet, Rfl: 0   potassium chloride (MICRO-K) 10 MEQ CR capsule, Take by mouth., Disp: , Rfl:    promethazine (PHENERGAN) 25 MG tablet, Takes as needed, Disp: , Rfl:    promethazine (PHENERGAN) 25 MG tablet, Take 1 tablet (25 mg total) by mouth every six (6) hours as needed for nausea., Disp: 30 tablet, Rfl: 2   zolpidem (AMBIEN CR) 6.25 MG CR tablet, TAKE 1 TABLET BY MOUTH NIGHTLY AS NEEDED FOR SLEEP, Disp: 30 tablet, Rfl: 0  Observations/Objective: Patient is well-developed, well-nourished in no acute distress.  Resting comfortably   Head is normocephalic, atraumatic.  No labored breathing.  Speech is clear and coherent with logical content.  Patient is alert and oriented at baseline.  Reports left ear pain radiating into left jaw  Assessment and Plan: 1. Non-recurrent acute suppurative otitis media of left ear without spontaneous rupture of tympanic membrane - ciprofloxacin-dexamethasone (CIPRODEX) OTIC suspension; Place 4 drops into the left ear 2 (two) times daily. X 5-7 days  Dispense: 7.5 mL; Refill:  0  - Suspect otitis media - Ciprodex prescribed  - Tylenol and ibuprofen as needed - Push fluids - Flonase can help - Rest - Seek in person evaluation if not improving or worsening  Follow Up Instructions: I discussed the assessment and treatment plan with the patient. The patient was provided an opportunity to ask questions and all were answered. The patient agreed with the plan and demonstrated an understanding of the instructions.  A copy of instructions were sent to the patient via MyChart unless otherwise noted below.    The patient was advised to call back or seek an in-person evaluation if the symptoms worsen or if the condition fails to improve as anticipated.  Time:  I spent 10 minutes with the patient via telehealth technology discussing the above problems/concerns.    Mar Daring, PA-C

## 2021-11-06 ENCOUNTER — Other Ambulatory Visit: Payer: Self-pay

## 2021-11-07 DIAGNOSIS — Z760 Encounter for issue of repeat prescription: Secondary | ICD-10-CM | POA: Diagnosis not present

## 2021-11-15 DIAGNOSIS — K219 Gastro-esophageal reflux disease without esophagitis: Secondary | ICD-10-CM

## 2021-11-15 DIAGNOSIS — K2960 Other gastritis without bleeding: Secondary | ICD-10-CM

## 2021-11-15 DIAGNOSIS — R110 Nausea: Secondary | ICD-10-CM

## 2021-11-15 DIAGNOSIS — D128 Benign neoplasm of rectum: Secondary | ICD-10-CM

## 2021-11-22 DIAGNOSIS — R002 Palpitations: Secondary | ICD-10-CM | POA: Diagnosis not present

## 2021-11-22 DIAGNOSIS — I471 Supraventricular tachycardia: Secondary | ICD-10-CM | POA: Diagnosis not present

## 2021-11-28 MED FILL — Apixaban Tab 5 MG: ORAL | 30 days supply | Qty: 60 | Fill #8 | Status: AC

## 2021-11-29 ENCOUNTER — Other Ambulatory Visit: Payer: Self-pay

## 2021-12-03 DIAGNOSIS — L919 Hypertrophic disorder of the skin, unspecified: Secondary | ICD-10-CM | POA: Diagnosis not present

## 2021-12-03 DIAGNOSIS — L918 Other hypertrophic disorders of the skin: Secondary | ICD-10-CM | POA: Diagnosis not present

## 2021-12-03 DIAGNOSIS — R52 Pain, unspecified: Secondary | ICD-10-CM | POA: Diagnosis not present

## 2021-12-13 ENCOUNTER — Other Ambulatory Visit: Payer: Self-pay

## 2021-12-13 MED ORDER — CARESTART COVID-19 HOME TEST VI KIT
PACK | 0 refills | Status: DC
  Filled 2021-12-13: qty 2, 4d supply, fill #0

## 2021-12-16 ENCOUNTER — Telehealth: Payer: Medicaid Other | Admitting: Physician Assistant

## 2021-12-16 DIAGNOSIS — J029 Acute pharyngitis, unspecified: Secondary | ICD-10-CM

## 2021-12-16 MED ORDER — PREDNISONE 10 MG PO TABS: 30.0000 mg | ORAL_TABLET | Freq: Every day | ORAL | 0 refills | Status: AC

## 2021-12-16 NOTE — Patient Instructions (Signed)
Courtney Hanson, thank you for joining Leeanne Rio, PA-C for today's virtual visit.  While this provider is not your primary care provider (PCP), if your PCP is located in our provider database this encounter information will be shared with them immediately following your visit.  Consent: (Patient) Courtney Hanson provided verbal consent for this virtual visit at the beginning of the encounter.  Current Medications:  Current Outpatient Medications:    ALPRAZolam (XANAX) 0.5 MG tablet, Take by mouth., Disp: , Rfl:    apixaban (ELIQUIS) 5 MG TABS tablet, TAKE 1 TABLET BY MOUTH TWICE DAILY, Disp: 60 tablet, Rfl: 10   ciprofloxacin-dexamethasone (CIPRODEX) OTIC suspension, Place 4 drops into the left ear 2 (two) times daily. X 5-7 days, Disp: 7.5 mL, Rfl: 0   clonazePAM (KLONOPIN) 1 MG tablet, Take by mouth., Disp: , Rfl:    COVID-19 At Home Antigen Test (CARESTART COVID-19 HOME TEST) KIT, Use as directed, Disp: 2 kit, Rfl: 0   cyclobenzaprine (FLEXERIL) 5 MG tablet, Take by mouth., Disp: , Rfl:    esomeprazole (NEXIUM) 40 MG capsule, Take 1 capsule (40 mg total) by mouth daily before breakfast., Disp: 30 capsule, Rfl: 3   fluconazole (DIFLUCAN) 150 MG tablet, Take 1 tablet today and second tablet 3 days later., Disp: 2 tablet, Rfl: 0   FLUoxetine (PROZAC) 20 MG tablet, Take 20 mg by mouth daily., Disp: , Rfl:    FLUoxetine (PROZAC) 40 MG capsule, Take 1 capsule (40 mg total) by mouth daily., Disp: 90 capsule, Rfl: 0   furosemide (LASIX) 20 MG tablet, Take by mouth., Disp: , Rfl:    metroNIDAZOLE (FLAGYL) 500 MG tablet, Take 1 tablet (500 mg total) by mouth 2 (two) times daily., Disp: 14 tablet, Rfl: 0   potassium chloride (MICRO-K) 10 MEQ CR capsule, Take by mouth., Disp: , Rfl:    promethazine (PHENERGAN) 25 MG tablet, Takes as needed, Disp: , Rfl:    promethazine (PHENERGAN) 25 MG tablet, Take 1 tablet (25 mg total) by mouth every six (6) hours as needed for nausea., Disp: 30 tablet,  Rfl: 2   zolpidem (AMBIEN CR) 6.25 MG CR tablet, TAKE 1 TABLET BY MOUTH NIGHTLY AS NEEDED FOR SLEEP, Disp: 30 tablet, Rfl: 0   Medications ordered in this encounter:  No orders of the defined types were placed in this encounter.    *If you need refills on other medications prior to your next appointment, please contact your pharmacy*  Follow-Up: Call back or seek an in-person evaluation if the symptoms worsen or if the condition fails to improve as anticipated.  Other Instructions Please keep well-hydrated and get plenty of rest. Tylenol for pain. Start steroid as directed. Continue running a humidifier in the bedroom, salt water gargles, etc. Start OTC Vitamin C 1000 mg daily, Vitamin D3 1000 units daily and a zinc supplement.  Let us know if there are any new or worsening symptoms or if symptoms are not easing up in the next 48-72 hours.    If you have been instructed to have an in-person evaluation today at a local Urgent Care facility, please use the link below. It will take you to a list of all of our available [facility], including address, phone number and hours of operation. Please do not delay care.  [facility]  If you or a family member do not have a primary care provider, use the link below to schedule a visit and establish care. When you  choose a [facility] primary care physician or advanced practice provider, you gain a long-term partner in health. Find a Primary Care Provider  Learn more about [facility]'s in-office and virtual care options: [facility]

## 2021-12-16 NOTE — Progress Notes (Signed)
Virtual Visit Consent   DELENN AHN, you are scheduled for a virtual visit with a [facility] provider today.     Just as with appointments in the office, your consent must be obtained to participate.  Your consent will be active for this visit and any virtual visit you may have with one of our providers in the next 365 days.     If you have a MyChart account, a copy of this consent can be sent to you electronically.  All virtual visits are billed to your insurance company just like a traditional visit in the office.    As this is a virtual visit, video technology does not allow for your provider to perform a traditional examination.  This may limit your provider's ability to fully assess your condition.  If your provider identifies any concerns that need to be evaluated in person or the need to arrange testing (such as labs, EKG, etc.), we will make arrangements to do so.     Although advances in technology are sophisticated, we cannot ensure that it will always work on either your end or our end.  If the connection with a video visit is poor, the visit may have to be switched to a telephone visit.  With either a video or telephone visit, we are not always able to ensure that we have a secure connection.     I need to obtain your verbal consent now.   Are you willing to proceed with your visit today?    Courtney Hanson has provided verbal consent on 12/16/2021 for a virtual visit (video or telephone).   Leeanne Rio, [place]   Date: 12/16/2021 8:06 PM   Virtual Visit via Video Note   I, Leeanne Rio, connected with  Courtney Hanson  ([id number], [birth year]) on 12/16/21 at  7:45 PM EST by a video-enabled telemedicine application and verified that I am speaking with the correct person using two identifiers.  Location: Patient: Virtual Visit Location Patient: Home Provider: Virtual Visit Location Provider: Home Office   I discussed the limitations of evaluation and management by  telemedicine and the availability of in person appointments. The patient expressed understanding and agreed to proceed.    History of Present Illness: Courtney Hanson is a 46 y.o. who identifies as a female who was assigned female at birth, and is being seen today for sore throat for the past several days and has been worsening. Mainly hurting when she swallows. Denies fever, chills. Some occasional PND but mild. Notes tender nodes in her neck and swollen tonsils without noted exudate. Denies SOB. Denies recent travel. Sister with similar symptoms and was negative for strep and flu. Has taken Nyquil and robitussin along with cough drops for symptoms.   HPI: HPI  Problems:  Patient Active Problem List   Diagnosis Date Noted   Chronic diarrhea    Adenomatous rectal polyp    Nausea without vomiting    Chronic GERD    Erosive gastritis    Prediabetes 10/18/2020   Chronic right-sided thoracic back pain 09/23/2019   Vitamin D deficiency 06/04/2017   Deep venous thrombosis of lower extremity ([place]) 06/19/2016   Tachycardia 06/19/2016   Major depressive disorder, recurrent episode, moderate ([place]) 07/13/2015   PMDD (premenstrual dysphoric disorder) 12/21/2013   GAD (generalized anxiety disorder) 06/24/2013   Morbid obesity with BMI of 60.0-69.9, adult ([place]) 06/24/2013   Endometritis 05/31/2013    Allergies:  Allergies  Allergen Reactions  Tetanus Toxoid Dermatitis, Hives, Itching and Rash   Topiramate     Shaking hands & whoozy   Venlafaxine     Elevation of blood pressure   Medications:  Current Outpatient Medications:    predniSONE (DELTASONE) 10 MG tablet, Take 3 tablets (30 mg total) by mouth daily with breakfast for 4 days., Disp: 12 tablet, Rfl: 0   ALPRAZolam (XANAX) 0.5 MG tablet, Take by mouth., Disp: , Rfl:    apixaban (ELIQUIS) 5 MG TABS tablet, TAKE 1 TABLET BY MOUTH TWICE DAILY, Disp: 60 tablet, Rfl: 10   cyclobenzaprine (FLEXERIL) 5 MG tablet, Take by mouth., Disp: , Rfl:     esomeprazole (NEXIUM) 40 MG capsule, Take 1 capsule (40 mg total) by mouth daily before breakfast., Disp: 30 capsule, Rfl: 3   FLUoxetine (PROZAC) 40 MG capsule, Take 1 capsule (40 mg total) by mouth daily., Disp: 90 capsule, Rfl: 0   furosemide (LASIX) 20 MG tablet, Take by mouth., Disp: , Rfl:    potassium chloride (MICRO-K) 10 MEQ CR capsule, Take by mouth., Disp: , Rfl:    promethazine (PHENERGAN) 25 MG tablet, Take 1 tablet (25 mg total) by mouth every six (6) hours as needed for nausea., Disp: 30 tablet, Rfl: 2   zolpidem (AMBIEN CR) 6.25 MG CR tablet, TAKE 1 TABLET BY MOUTH NIGHTLY AS NEEDED FOR SLEEP, Disp: 30 tablet, Rfl: 0  Observations/Objective: Patient is well-developed, well-nourished in no acute distress.  Resting comfortably at home.  Head is normocephalic, atraumatic.  No labored breathing. Speech is clear and coherent with logical content.  Patient is alert and oriented at baseline.   Assessment and Plan: 1. Acute viral pharyngitis - predniSONE (DELTASONE) 10 MG tablet; Take 3 tablets (30 mg total) by mouth daily with breakfast for 4 days.  Dispense: 12 tablet; Refill: 0  Increase fluids. Supportive measures and OTC medications reviewed. Will start short-course of steroid. Follow-up if not resolving.   Follow Up Instructions: I discussed the assessment and treatment plan with the patient. The patient was provided an opportunity to ask questions and all were answered. The patient agreed with the plan and demonstrated an understanding of the instructions.  A copy of instructions were sent to the patient via MyChart unless otherwise noted below.   The patient was advised to call back or seek an in-person evaluation if the symptoms worsen or if the condition fails to improve as anticipated.  Time:  I spent 10 minutes with the patient via telehealth technology discussing the above problems/concerns.    Leeanne Rio, PA-C

## 2021-12-19 ENCOUNTER — Other Ambulatory Visit: Payer: Self-pay

## 2021-12-19 MED ORDER — ELIQUIS 5 MG PO TABS
ORAL_TABLET | ORAL | 3 refills | Status: DC
  Filled 2021-12-19 – 2022-01-02 (×2): qty 60, 30d supply, fill #0
  Filled 2022-01-29: qty 60, 30d supply, fill #1

## 2022-01-02 ENCOUNTER — Other Ambulatory Visit: Payer: Self-pay

## 2022-01-06 ENCOUNTER — Other Ambulatory Visit: Payer: Self-pay

## 2022-01-29 ENCOUNTER — Other Ambulatory Visit: Payer: Self-pay

## 2022-02-11 ENCOUNTER — Encounter: Payer: Self-pay | Admitting: Family Medicine

## 2022-02-11 ENCOUNTER — Ambulatory Visit (INDEPENDENT_AMBULATORY_CARE_PROVIDER_SITE_OTHER): Payer: No Typology Code available for payment source | Admitting: Family Medicine

## 2022-02-11 ENCOUNTER — Other Ambulatory Visit: Payer: Self-pay

## 2022-02-11 VITALS — BP 110/74 | HR 96 | Temp 98.6°F | Ht 62.0 in | Wt 349.0 lb

## 2022-02-11 DIAGNOSIS — F411 Generalized anxiety disorder: Secondary | ICD-10-CM

## 2022-02-11 DIAGNOSIS — Z79899 Other long term (current) drug therapy: Secondary | ICD-10-CM | POA: Diagnosis not present

## 2022-02-11 DIAGNOSIS — K219 Gastro-esophageal reflux disease without esophagitis: Secondary | ICD-10-CM

## 2022-02-11 DIAGNOSIS — I2699 Other pulmonary embolism without acute cor pulmonale: Secondary | ICD-10-CM

## 2022-02-11 DIAGNOSIS — Z6841 Body Mass Index (BMI) 40.0 and over, adult: Secondary | ICD-10-CM | POA: Diagnosis not present

## 2022-02-11 DIAGNOSIS — R600 Localized edema: Secondary | ICD-10-CM | POA: Insufficient documentation

## 2022-02-11 DIAGNOSIS — R7303 Prediabetes: Secondary | ICD-10-CM | POA: Diagnosis not present

## 2022-02-11 DIAGNOSIS — F5101 Primary insomnia: Secondary | ICD-10-CM | POA: Insufficient documentation

## 2022-02-11 MED ORDER — ZOLPIDEM TARTRATE ER 6.25 MG PO TBCR
EXTENDED_RELEASE_TABLET | Freq: Every evening | ORAL | 0 refills | Status: DC | PRN
  Filled 2022-02-11: qty 30, 30d supply, fill #0

## 2022-02-11 MED ORDER — FUROSEMIDE 20 MG PO TABS
20.0000 mg | ORAL_TABLET | ORAL | 3 refills | Status: AC | PRN
  Filled 2022-02-11: qty 30, 30d supply, fill #0

## 2022-02-11 MED ORDER — WEGOVY 0.5 MG/0.5ML ~~LOC~~ SOAJ
0.5000 mg | SUBCUTANEOUS | 1 refills | Status: DC
  Filled 2022-02-11 – 2022-03-05 (×3): qty 2, 28d supply, fill #0

## 2022-02-11 MED ORDER — ALPRAZOLAM 0.5 MG PO TABS
0.5000 mg | ORAL_TABLET | Freq: Two times a day (BID) | ORAL | 1 refills | Status: DC | PRN
  Filled 2022-02-11: qty 30, 15d supply, fill #0
  Filled 2022-07-17: qty 30, 15d supply, fill #1

## 2022-02-11 MED ORDER — POTASSIUM CHLORIDE ER 10 MEQ PO CPCR
20.0000 meq | ORAL_CAPSULE | ORAL | 3 refills | Status: AC | PRN
  Filled 2022-02-11: qty 30, 15d supply, fill #0

## 2022-02-11 MED ORDER — APIXABAN 5 MG PO TABS
5.0000 mg | ORAL_TABLET | Freq: Two times a day (BID) | ORAL | 1 refills | Status: DC
  Filled 2022-02-11: qty 180, 90d supply, fill #0

## 2022-02-11 NOTE — Progress Notes (Signed)
New Patient Office Visit  Subjective:  Patient ID: Courtney Hanson, female    DOB: May 22, 1976  Age: 46 y.o. MRN: [id number]  CC:  Chief Complaint  Patient presents with   [place] presents for new pt.  To establish. New ins PE-2021-unprovoked.  On Eliquis. No bleeding-just bruising.  Needs referral to Heme(saw one Nov 2021-[facility]).  Looking for other reasons why got.  Pt hoping can get off eliquis.  Also, H/o DVT after chole 2004.  Then again 2017-unprovoked.   Wt gain-has gained 60# over 1 yr. No exercise.  "Tries" to eat right but eats  Drug rep lunches.  Anxiety-xanax once/wk. Stopped prozac-sexual SE and GI upset/nausea/diarrhea/constipation.  No SI Insomnia-using ambien about 2-4x/month Edema-using Lasix/K PRN. GERD-had a lot of problems-GERD,diarrhea/constipation/pain.  Saw GI-on nexium-helps  Mamm Sept, colon Oct, pap Aug-colpo in [facility] Past Medical History:  Diagnosis Date   Anxiety    GERD (gastroesophageal reflux disease)    Lower extremity edema    Pulmonary embolism [facility])     Past Surgical History:  Procedure Laterality Date   CHOLECYSTECTOMY, LAPAROSCOPIC N/A 2003   COLONOSCOPY WITH PROPOFOL N/A 10/17/2021   Procedure: COLONOSCOPY WITH PROPOFOL;  Surgeon: Lin Landsman, MD;  Location: [facility] ENDOSCOPY;  Service: Gastroenterology;  Laterality: N/A;   DILITATION & CURRETTAGE/HYSTROSCOPY WITH ESSURE     ESOPHAGOGASTRODUODENOSCOPY N/A 10/17/2021   Procedure: ESOPHAGOGASTRODUODENOSCOPY (EGD);  Surgeon: Lin Landsman, MD;  Location: [facility] ENDOSCOPY;  Service: Gastroenterology;  Laterality: N/A;   LEEP N/A 2003    Family History  Problem Relation Age of Onset   Hyperlipidemia Mother    Arthritis Mother    Hypertension Mother    Diabetes Mother    Hyperlipidemia Father    Alcohol abuse Father    Arthritis Father    Hypertension Father    Breast cancer Sister    Alcohol abuse Maternal Grandfather    Diabetes Paternal  Grandmother    Cancer Paternal Grandfather    Birth defects Paternal Grandfather    Alcohol abuse Paternal Grandfather     Social History   Socioeconomic History   Marital status: Single    Spouse name: Not on file   Number of children: 4   Years of education: Not on file   Highest education level: Not on file  Occupational History   Not on file  Tobacco Use   Smoking status: Never   Smokeless tobacco: Never  Vaping Use   Vaping Use: Never used  Substance and Sexual Activity   Alcohol use: Not Currently   Drug use: Never   Sexual activity: Yes    Birth control/protection: Surgical    Comment: essure  Other Topics Concern   Not on file  Social History Narrative   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   Eyes:  Negative for blurred vision and double vision.  Respiratory:  Negative for cough, shortness of breath and wheezing.   Cardiovascular:  Positive for leg swelling. Negative for chest pain and palpitations.  Genitourinary:  Negative for dysuria and frequency.  Musculoskeletal:  Negative for joint pain and myalgias.  Neurological:  Positive for dizziness. Negative for headaches.  Psychiatric/Behavioral:  Negative for depression and suicidal ideas.    Occ dizziness.   Objective:   Today's Vitals: BP 110/74    Pulse 96    Temp 98.6 F (37 C) (Temporal)    Ht '5\' 2"'$  (1.575 m)    Wt (!) 349 lb (158.3 kg)    LMP 01/04/2022 (Exact Date)    SpO2 98%    BMI 63.83 kg/m   Physical Exam  Gen: WDWN NAD MO AAF HEENT: NCAT, conjunctiva not injected, sclera nonicteric TM WNL B, OP moist, no exudates  NECK:   supple, no thyromegaly, no nodes, no carotid bruits CARDIAC: RRR, S1S2+, no murmur. DP 2+B LUNGS: CTAB. No wheezes ABDOMEN:  BS+, soft, NTND, No HSM, no masses EXT:  no edema MSK: no gross abnormalities.  NEURO: A&O x3.  CN II-XII intact.  PSYCH: normal mood. Good eye contact   Assessment & Plan:   Problem List Items Addressed This Visit       Cardiovascular and Mediastinum   Pulmonary embolism ([place]) - Primary   Relevant Medications   apixaban (ELIQUIS) 5 MG TABS tablet   furosemide (LASIX) 20 MG tablet   Other Relevant Orders   Ambulatory referral to Hematology / Oncology     Digestive   Chronic GERD     Other   GAD (generalized anxiety disorder)   Relevant Medications   ALPRAZolam (XANAX) 0.5 MG tablet   Morbid obesity with BMI of 60.0-69.9, adult (HCC)   Relevant Medications   Semaglutide-Weight Management (WEGOVY) 0.5 MG/0.5ML SOAJ   Other Relevant Orders   TSH   Hemoglobin A1c   Prediabetes   Relevant Orders   Lipid panel   TSH   Hemoglobin A1c   Primary insomnia   Localized edema   Other Visit Diagnoses     High risk medication use       Relevant Orders   Comprehensive metabolic panel   CBC with Differential/Platelet     1.  Pulmonary embolism by history.  Also has had 2 DVTs.  Stable on Eliquis.  Advised patient that I suspect she will be on Eliquis for life.  States she is due for consult with hematology.  Will refer.  Check CMP and CBC due to high risk meds. 2.  Morbid obesity with 60 pound weight gain in past 1 year-she does not exercise nor follow an appropriate diet.  We discussed several options.  Given history of prediabetes, palpitations, will try we weygovy at-sample given to the patient.  Discussed diet and exercise.  Side effects discussed.  Follow-up in 1 month.  Check TSH, lipids, A1c 3.  Anxiety-patient self discontinued Prozac.  Doing okay thus far.  Uses Xanax about once a week.  Due for refill.  PDMP checked. 4.  Insomnia-intermittent.   Uses Ambien occasionally.  Renewed meds. 5.  Intermittent lower extremity edema-takes Lasix with potassium intermittently.  Renewed 6.  GERD-has had work-up by GI.  Stable on meds.  Continue 7.  Prediabetes-discussed TLC.  Will check A1c.  Start Wegovy 8.  High risk meds-check CBC, CMP  Follow-up in 1 month for annual wellness and weight loss.  Outpatient Encounter Medications as of 02/11/2022  Medication Sig   albuterol (VENTOLIN HFA) 108 (90 Base) MCG/ACT inhaler SMARTSIG:2 Puff(s) By Mouth Every 6 Hours PRN   cyclobenzaprine (FLEXERIL) 5 MG tablet Take by mouth as needed.   promethazine (PHENERGAN) 25 MG  tablet Take 1 tablet (25 mg total) by mouth every six (6) hours as needed for nausea.   Semaglutide-Weight Management (WEGOVY) 0.5 MG/0.5ML SOAJ Inject 0.5 mg into the skin once a week.   [DISCONTINUED] ALPRAZolam (XANAX) 0.5 MG tablet Take by mouth as needed.   [DISCONTINUED] apixaban (ELIQUIS) 5 MG TABS tablet Take by mouth.   [DISCONTINUED] FLUoxetine (PROZAC) 40 MG capsule Take 1 capsule (40 mg total) by mouth daily. (Patient taking differently: Take 40 mg by mouth as needed.)   ALPRAZolam (XANAX) 0.5 MG tablet Take 1 tablet (0.5 mg total) by mouth 2 (two) times daily as needed.   apixaban (ELIQUIS) 5 MG TABS tablet Take 1 tablet (5 mg total) by mouth 2 (two) times daily.   esomeprazole (NEXIUM) 40 MG capsule Take 1 capsule (40 mg total) by mouth daily before breakfast.   furosemide (LASIX) 20 MG tablet Take 1 tablet (20 mg total) by mouth as needed.   potassium chloride (MICRO-K) 10 MEQ CR capsule Take 2 capsules (20 mEq total) by mouth as needed.   zolpidem (AMBIEN CR) 6.25 MG CR tablet TAKE 1 TABLET BY MOUTH NIGHTLY AS NEEDED FOR SLEEP   [DISCONTINUED] apixaban (ELIQUIS) 5 MG TABS tablet Take 1 tablet (5 mg total) by mouth Two (2) times a day.   [DISCONTINUED] furosemide (LASIX) 20 MG tablet Take by mouth as needed.   [DISCONTINUED] potassium chloride (MICRO-K) 10 MEQ CR capsule Take  by mouth as needed.   [DISCONTINUED] zolpidem (AMBIEN CR) 6.25 MG CR tablet TAKE 1 TABLET BY MOUTH NIGHTLY AS NEEDED FOR SLEEP   No facility-administered encounter medications on file as of 02/11/2022.    Follow-up: Return in about 4 weeks (around 03/11/2022) for annual and wt loss.   Wellington Hampshire, MD

## 2022-02-11 NOTE — Progress Notes (Deleted)
? ?New Patient Office Visit ? ?Subjective:  ?Patient ID: Courtney Hanson, female    DOB: 1976/01/15  Age: 46 y.o. MRN: [id number] ? ?CC: No chief complaint on file. ? ? ?HPI ?Courtney Hanson presents for *** ? ?Past Medical History:  ?Diagnosis Date  ? Lower extremity edema   ? Pulmonary embolism ([place])   ? ? ?Past Surgical History:  ?Procedure Laterality Date  ? COLONOSCOPY WITH PROPOFOL N/A 10/17/2021  ? Procedure: COLONOSCOPY WITH PROPOFOL;  Surgeon: Lin Landsman, MD;  Location: [facility] ENDOSCOPY;  Service: Gastroenterology;  Laterality: N/A;  ? ESOPHAGOGASTRODUODENOSCOPY N/A 10/17/2021  ? Procedure: ESOPHAGOGASTRODUODENOSCOPY (EGD);  Surgeon: Lin Landsman, MD;  Location: [facility] ENDOSCOPY;  Service: Gastroenterology;  Laterality: N/A;  ? NO PAST SURGERIES    ? ? ?Family History  ?Problem Relation Age of Onset  ? Hypertension Mother   ? Diabetes Mother   ? Hypertension Father   ? ? ?Social History  ? ?Socioeconomic History  ? Marital status: Single  ?  Spouse name: Not on file  ? Number of children: Not on file  ? Years of education: Not on file  ? Highest education level: Not on file  ?Occupational History  ? Not on file  ?Tobacco Use  ? Smoking status: Never  ? Smokeless tobacco: Never  ?Vaping Use  ? Vaping Use: Never used  ?Substance and Sexual Activity  ? Alcohol use: Not Currently  ? Drug use: Not Currently  ? Sexual activity: Not on file  ?Other Topics Concern  ? Not on file  ?Social History Narrative  ? Not on file  ? ?Social Determinants of Health  ? ?Financial Resource Strain: Not on file  ?Food Insecurity: Not on file  ?Transportation Needs: Not on file  ?Physical Activity: Not on file  ?Stress: Not on file  ?Social Connections: Not on file  ?Intimate Partner Violence: Not on file  ? ? ?ROS  ? ?Objective:  ? ?Today's Vitals: There were no vitals taken for this visit. ? ? ?Physical Exam ?Constitutional:   ?   Appearance: Normal appearance.  ?HENT:  ?   Head: Normocephalic and atraumatic.  ?   Right  Ear: Tympanic membrane, ear canal and external ear normal.  ?   Left Ear: Tympanic membrane, ear canal and external ear normal.  ?   Nose: Nose normal.  ?   Mouth/Throat:  ?   Mouth: Mucous membranes are moist.  ?   Pharynx: Oropharynx is clear.  ?Eyes:  ?   General: No scleral icterus. ?   Extraocular Movements: Extraocular movements intact.  ?   Conjunctiva/sclera: Conjunctivae normal.  ?   Pupils: Pupils are equal, round, and reactive to light.  ?Neck:  ?   Vascular: No carotid bruit.  ?Cardiovascular:  ?   Rate and Rhythm: Normal rate and regular rhythm.  ?   Pulses: Normal pulses.  ?   Heart sounds: Murmur heard.  ?Pulmonary:  ?   Effort: Pulmonary effort is normal.  ?   Breath sounds: Normal breath sounds.  ?Abdominal:  ?   General: Bowel sounds are normal. There is no distension.  ?   Palpations: Abdomen is soft. There is no mass.  ?   Tenderness: There is abdominal tenderness.  ?Musculoskeletal:  ?   Cervical back: Neck supple.  ?   Right lower leg: No edema.  ?   Left lower leg: No edema.  ?Lymphadenopathy:  ?   Cervical: No cervical adenopathy.  ?Neurological:  ?  General: No focal deficit present.  ?   Mental Status: She is alert and oriented to person, place, and time.  ?Psychiatric:     ?   Mood and Affect: Mood normal.  ?  ? ?Assessment & Plan:  ? ?Problem List Items Addressed This Visit   ?None ? ? ?Outpatient Encounter Medications as of 02/11/2022  ?Medication Sig  ? ALPRAZolam (XANAX) 0.5 MG tablet Take by mouth.  ? apixaban (ELIQUIS) 5 MG TABS tablet Take 1 tablet (5 mg total) by mouth Two (2) times a day.  ? cyclobenzaprine (FLEXERIL) 5 MG tablet Take by mouth.  ? esomeprazole (NEXIUM) 40 MG capsule Take 1 capsule (40 mg total) by mouth daily before breakfast.  ? FLUoxetine (PROZAC) 40 MG capsule Take 1 capsule (40 mg total) by mouth daily.  ? furosemide (LASIX) 20 MG tablet Take by mouth.  ? potassium chloride (MICRO-K) 10 MEQ CR capsule Take by mouth.  ? promethazine (PHENERGAN) 25 MG tablet Take  1 tablet (25 mg total) by mouth every six (6) hours as needed for nausea.  ? zolpidem (AMBIEN CR) 6.25 MG CR tablet TAKE 1 TABLET BY MOUTH NIGHTLY AS NEEDED FOR SLEEP  ? ?No facility-administered encounter medications on file as of 02/11/2022.  ? ? ?Follow-up: No follow-ups on file.  ? ?Wellington Hampshire, MD ?

## 2022-02-11 NOTE — Patient Instructions (Signed)
Welcome to [facility] [place]! It was a pleasure meeting you today.  As discussed, Please schedule a 1 month follow up visit today.  PLEASE NOTE:  If you had any LAB tests please let us know if you have not heard back within a few days. You may see your results on MyChart before we have a chance to review them but we will give you a call once they are reviewed by us. If we ordered any REFERRALS today, please let us know if you have not heard from their office within the next week.  Let us know through MyChart if you are needing REFILLS, or have your pharmacy send us the request. You can also use MyChart to communicate with me or any office staff.  Please try these tips to maintain a healthy lifestyle:  Eat most of your calories during the day when you are active. Eliminate processed foods including packaged sweets (pies, cakes, cookies), reduce intake of potatoes, white bread, white pasta, and white rice. Look for whole grain options, oat flour or almond flour.  Each meal should contain half fruits/vegetables, one quarter protein, and one quarter carbs (no bigger than a computer mouse).  Cut down on sweet beverages. This includes juice, soda, and sweet tea. Also watch fruit intake, though this is a healthier sweet option, it still contains natural sugar! Limit to 3 servings daily.  Drink at least 1 glass of water with each meal and aim for at least 8 glasses per day  Exercise at least 150 minutes every week.   

## 2022-02-12 LAB — COMPREHENSIVE METABOLIC PANEL
ALT: 12 U/L (ref 0–35)
AST: 14 U/L (ref 0–37)
Albumin: 3.9 g/dL (ref 3.5–5.2)
Alkaline Phosphatase: 54 U/L (ref 39–117)
BUN: 8 mg/dL (ref 6–23)
CO2: 29 mEq/L (ref 19–32)
Calcium: 9.6 mg/dL (ref 8.4–10.5)
Chloride: 102 mEq/L (ref 96–112)
Creatinine, Ser: 0.58 mg/dL (ref 0.40–1.20)
GFR: 107.66 mL/min (ref >60.00)
Glucose, Bld: 87 mg/dL (ref 70–99)
Potassium: 4.3 mEq/L (ref 3.5–5.1)
Sodium: 139 mEq/L (ref 135–145)
Total Bilirubin: 0.4 mg/dL (ref 0.2–1.2)
Total Protein: 6.9 g/dL (ref 6.0–8.3)

## 2022-02-12 LAB — LIPID PANEL
Cholesterol: 223 mg/dL — ABNORMAL HIGH (ref 0–200)
HDL: 90.6 mg/dL (ref >39.00)
LDL Cholesterol: 109 mg/dL — ABNORMAL HIGH (ref 0–99)
NonHDL: 132.78
Total CHOL/HDL Ratio: 2
Triglycerides: 118 mg/dL (ref 0.0–149.0)
VLDL: 23.6 mg/dL (ref 0.0–40.0)

## 2022-02-12 LAB — CBC WITH DIFFERENTIAL/PLATELET
Basophils Absolute: 0.1 10*3/uL (ref 0.0–0.1)
Basophils Relative: 1.5 % (ref 0.0–3.0)
Eosinophils Absolute: 0.1 10*3/uL (ref 0.0–0.7)
Eosinophils Relative: 2 % (ref 0.0–5.0)
HCT: 37 % (ref 36.0–46.0)
Hemoglobin: 12 g/dL (ref 12.0–15.0)
Lymphocytes Relative: 39.7 % (ref 12.0–46.0)
Lymphs Abs: 2.5 10*3/uL (ref 0.7–4.0)
MCHC: 32.4 g/dL (ref 30.0–36.0)
MCV: 87.5 fl (ref 78.0–100.0)
Monocytes Absolute: 0.4 10*3/uL (ref 0.1–1.0)
Monocytes Relative: 7.2 % (ref 3.0–12.0)
Neutro Abs: 3.1 10*3/uL (ref 1.4–7.7)
Neutrophils Relative %: 49.6 % (ref 43.0–77.0)
Platelets: 265 10*3/uL (ref 150.0–400.0)
RBC: 4.23 Mil/uL (ref 3.87–5.11)
RDW: 15.2 % (ref 11.5–15.5)
WBC: 6.2 10*3/uL (ref 4.0–10.5)

## 2022-02-12 LAB — TSH: TSH: 2.05 u[IU]/mL (ref 0.35–5.50)

## 2022-02-12 LAB — HEMOGLOBIN A1C: Hgb A1c MFr Bld: 6.1 % (ref 4.6–6.5)

## 2022-02-14 ENCOUNTER — Encounter: Payer: Self-pay | Admitting: Family Medicine

## 2022-02-14 NOTE — Telephone Encounter (Signed)
Patients insurance has been added. ?

## 2022-02-18 ENCOUNTER — Telehealth: Payer: Self-pay

## 2022-02-18 ENCOUNTER — Encounter: Payer: Self-pay | Admitting: Gastroenterology

## 2022-02-18 NOTE — Telephone Encounter (Signed)
-----   Message from Lin Landsman, MD sent at 02/18/2022 12:40 PM EDT ----- ?Regarding: Re: right upper quadrant ultrasound ?Caryl Pina ? ?Please check with patient if she still has a gallbladder intact, if yes, please order right upper quadrant ultrasound. ?From her chart review, she did not have a cholecystectomy ? ?Thanks ?RV ? ? ?

## 2022-02-18 NOTE — Telephone Encounter (Signed)
She does not have a gallbladder  ?

## 2022-02-19 ENCOUNTER — Other Ambulatory Visit: Payer: Self-pay

## 2022-02-24 ENCOUNTER — Telehealth: Payer: Self-pay

## 2022-02-24 ENCOUNTER — Encounter: Payer: Self-pay | Admitting: Oncology

## 2022-02-24 NOTE — Telephone Encounter (Signed)
Called patient to pre-chart her NP appointment with Dr. Tasia Catchings tomorrow. Patient did not answer. Left message to call back if she has any questions or concerns.  ?

## 2022-02-24 NOTE — Progress Notes (Signed)
Patient contacted for new patient visit.  ?

## 2022-02-25 ENCOUNTER — Inpatient Hospital Stay: Payer: No Typology Code available for payment source

## 2022-02-25 ENCOUNTER — Other Ambulatory Visit: Payer: Self-pay

## 2022-02-25 ENCOUNTER — Inpatient Hospital Stay: Payer: No Typology Code available for payment source | Attending: Oncology | Admitting: Oncology

## 2022-02-25 VITALS — BP 123/80 | HR 103 | Temp 96.8°F | Resp 18

## 2022-02-25 DIAGNOSIS — I8391 Asymptomatic varicose veins of right lower extremity: Secondary | ICD-10-CM | POA: Insufficient documentation

## 2022-02-25 DIAGNOSIS — Z86718 Personal history of other venous thrombosis and embolism: Secondary | ICD-10-CM | POA: Insufficient documentation

## 2022-02-25 DIAGNOSIS — Z86711 Personal history of pulmonary embolism: Secondary | ICD-10-CM | POA: Diagnosis present

## 2022-02-25 DIAGNOSIS — Z803 Family history of malignant neoplasm of breast: Secondary | ICD-10-CM | POA: Insufficient documentation

## 2022-02-25 DIAGNOSIS — Z7901 Long term (current) use of anticoagulants: Secondary | ICD-10-CM | POA: Insufficient documentation

## 2022-02-25 MED ORDER — APIXABAN 2.5 MG PO TABS
2.5000 mg | ORAL_TABLET | Freq: Two times a day (BID) | ORAL | 1 refills | Status: DC
  Filled 2022-02-25: qty 180, 90d supply, fill #0
  Filled 2022-06-18 – 2022-06-24 (×2): qty 60, 30d supply, fill #1
  Filled 2022-07-17: qty 60, 30d supply, fill #2
  Filled 2022-07-27 – 2022-08-14 (×3): qty 60, 30d supply, fill #3

## 2022-02-25 NOTE — Progress Notes (Signed)
?Hematology/Oncology Consult note ?[phone] [phone] ?  ? ?   ? ? ?Patient Care Team: ?Tawnya Crook, MD as PCP - General (Family Medicine) ?Earlie Server, MD as Consulting Physician (Hematology) ? ?REFERRING PROVIDER: ?Tawnya Crook, MD  ?CHIEF COMPLAINTS/REASON FOR VISIT:  ?Evaluation of history of pulmonary embolism and DVT ? ?HISTORY OF PRESENTING ILLNESS:  ? ?Courtney DELOACH is a  46 y.o.  female with PMH listed below was seen in consultation at the request of  Tawnya Crook, MD  for evaluation of history of pulmonary embolism and DVT. ? ?Patient has remote history of 2 episodes of lower extremity DVT. ?Per patient, first episode was in 2003, she developed acute left lower extremity calf vein DVT after gallbladder surgery.  She was treated with 3 months of anticoagulation  ?04/09/2013, 05/31/2013 bilateral lower extremity venous duplex showed no right extremity DVT.  Abnormalities consistent with the sequela of the prior venous obstructive process with findings that appear chronic/longstanding in nature and are not identified in the proximal vein. ? ?05/20/2016, second episode of acute left lower extremity DVT, provoked by birth control pills.  Patient was treated with Eliquis for a period of time. ? ?05/04/2020.She developed shortness of breath and chest pain.  Her work-up showed unprovoked pulmonary embolism on  ?Patient was started on anticoagulation and has been on Eliquis since then.  Currently she is on Eliquis 5 mg twice daily.  She tolerates well.  Denies any active bleeding events. ? ?Patient has had hypercoagulable work-up done in the past.  Negative factor V Leiden mutation, prothrombin gene mutation. ?She had  adequate Antithrombin 3 level, protein C and S activity. ?She was seen by hematology at [facility] on 10/08/2020.  Antiphospholipid panel was obtained by hematology was negative for lupus anticoagulant, negative anticardiolipin IgM/IgG, negative beta 2 glycoprotein protein  IgG/IgM ? ?Patient has chronic varicose veins on her bilateral lower extremity. ?Patient denies any shortness of breath.  She has experienced left anterior chest wall pain which she attributes to " pulled muscle". ? ?Patient has family history of breast cancer in sister. ? ?Review of Systems  ?Constitutional:  Negative for appetite change, chills, fatigue and fever.  ?HENT:   Negative for hearing loss and voice change.   ?Eyes:  Negative for eye problems.  ?Respiratory:  Negative for chest tightness and cough.   ?Cardiovascular:  Negative for chest pain.  ?Gastrointestinal:  Negative for abdominal distention, abdominal pain and blood in stool.  ?Endocrine: Negative for hot flashes.  ?Genitourinary:  Negative for difficulty urinating and frequency.   ?Musculoskeletal:  Negative for arthralgias.  ?Skin:  Negative for itching and rash.  ?Neurological:  Negative for extremity weakness.  ?Hematological:  Negative for adenopathy.  ?Psychiatric/Behavioral:  Negative for confusion.   ? ?MEDICAL HISTORY:  ?Past Medical History:  ?Diagnosis Date  ? Anxiety   ? GERD (gastroesophageal reflux disease)   ? Lower extremity edema   ? Pulmonary embolism ([place])   ? ? ?SURGICAL HISTORY: ?Past Surgical History:  ?Procedure Laterality Date  ? CHOLECYSTECTOMY, LAPAROSCOPIC N/A 2003  ? COLONOSCOPY WITH PROPOFOL N/A 10/17/2021  ? Procedure: COLONOSCOPY WITH PROPOFOL;  Surgeon: Lin Landsman, MD;  Location: [facility] ENDOSCOPY;  Service: Gastroenterology;  Laterality: N/A;  ? DILITATION & CURRETTAGE/HYSTROSCOPY WITH ESSURE    ? ESOPHAGOGASTRODUODENOSCOPY N/A 10/17/2021  ? Procedure: ESOPHAGOGASTRODUODENOSCOPY (EGD);  Surgeon: Lin Landsman, MD;  Location: [facility] ENDOSCOPY;  Service: Gastroenterology;  Laterality: N/A;  ? LEEP N/A 2003  ? ? ?SOCIAL  HISTORY: ?Social History  ? ?Socioeconomic History  ? Marital status: Single  ?  Spouse name: Not on file  ? Number of children: 4  ? Years of education: Not on file  ? Highest education  level: Not on file  ?Occupational History  ? Not on file  ?Tobacco Use  ? Smoking status: Never  ? Smokeless tobacco: Never  ?Vaping Use  ? Vaping Use: Never used  ?Substance and Sexual Activity  ? Alcohol use: Not Currently  ? Drug use: Never  ? Sexual activity: Yes  ?  Birth control/protection: Surgical  ?  Comment: essure  ?Other Topics Concern  ? Not on file  ?Social History Narrative  ? 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  ? ?Social Determinants of Health  ? ?Financial Resource Strain: Not on file  ?Food Insecurity: Not on file  ?Transportation Needs: Not on file  ?Physical Activity: Not on file  ?Stress: Not on file  ?Social Connections: Not on file  ?Intimate Partner Violence: Not on file  ? ? ?FAMILY HISTORY: ?Family History  ?Problem Relation Age of Onset  ? Hyperlipidemia Mother   ? Arthritis Mother   ? Hypertension Mother   ? Diabetes Mother   ? Hyperlipidemia Father   ? Alcohol abuse Father   ? Arthritis Father   ? Hypertension Father   ? Breast cancer Sister   ? Alcohol abuse Maternal Grandfather   ? Diabetes Paternal Grandmother   ? Cancer Paternal Grandfather   ? Birth defects Paternal Grandfather   ? Alcohol abuse Paternal Grandfather   ? ? ?ALLERGIES:  is allergic to tetanus toxoid, topiramate, and venlafaxine. ? ?MEDICATIONS:  ?Current Outpatient Medications  ?Medication Sig Dispense Refill  ? albuterol (VENTOLIN HFA) 108 (90 Base) MCG/ACT inhaler SMARTSIG:2 Puff(s) By Mouth Every 6 Hours PRN    ? ALPRAZolam (XANAX) 0.5 MG tablet Take 1 tablet (0.5 mg total) by mouth 2 (two) times daily as needed. 30 tablet 1  ? apixaban (ELIQUIS) 2.5 MG TABS tablet Take 1 tablet (2.5 mg total) by mouth 2 (two) times daily. 180 tablet 1  ? cyclobenzaprine (FLEXERIL) 5 MG tablet Take by mouth as needed.    ? esomeprazole (NEXIUM) 40 MG capsule Take 1 capsule (40 mg total) by mouth daily before breakfast. 30 capsule 3  ? furosemide (LASIX) 20 MG tablet Take 1 tablet (20 mg total) by mouth as needed. 30 tablet  3  ? potassium chloride (MICRO-K) 10 MEQ CR capsule Take 2 capsules (20 mEq total) by mouth as needed. 30 capsule 3  ? promethazine (PHENERGAN) 25 MG tablet Take 1 tablet (25 mg total) by mouth every six (6) hours as needed for nausea. 30 tablet 2  ? Semaglutide-Weight Management (WEGOVY) 0.5 MG/0.5ML SOAJ Inject 0.5 mg into the skin once a week. 2 mL 1  ? zolpidem (AMBIEN CR) 6.25 MG CR tablet TAKE 1 TABLET BY MOUTH NIGHTLY AS NEEDED FOR SLEEP 30 tablet 0  ? ?No current facility-administered medications for this visit.  ? ? ? ?PHYSICAL EXAMINATION: ?ECOG PERFORMANCE STATUS: 0 - Asymptomatic ?Vitals per epic record ? ?Physical Exam ?Constitutional:   ?   General: She is not in acute distress. ?   Appearance: She is obese.  ?HENT:  ?   Head: Normocephalic and atraumatic.  ?Eyes:  ?   General: No scleral icterus. ?Cardiovascular:  ?   Rate and Rhythm: Normal rate and regular rhythm.  ?   Heart sounds: Normal heart sounds.  ?Pulmonary:  ?  Effort: Pulmonary effort is normal. No respiratory distress.  ?   Breath sounds: No wheezing.  ?Abdominal:  ?   General: Bowel sounds are normal. There is no distension.  ?   Palpations: Abdomen is soft.  ?Musculoskeletal:     ?   General: No deformity. Normal range of motion.  ?   Cervical back: Normal range of motion and neck supple.  ?   Comments: Right lower extremity varicose vein  ?Skin: ?   General: Skin is warm and dry.  ?   Findings: No erythema or rash.  ?Neurological:  ?   Mental Status: She is alert and oriented to person, place, and time. Mental status is at baseline.  ?   Cranial Nerves: No cranial nerve deficit.  ?   Coordination: Coordination normal.  ?Psychiatric:     ?   Mood and Affect: Mood normal.  ? ? ?LABORATORY DATA:  ?I have reviewed the data as listed ?Lab Results  ?Component Value Date  ? WBC 6.2 02/11/2022  ? HGB 12.0 02/11/2022  ? HCT 37.0 02/11/2022  ? MCV 87.5 02/11/2022  ? PLT 265.0 02/11/2022  ? ?Recent Labs  ?  02/11/22 ?1504  ?NA 139  ?K 4.3  ?CL  102  ?CO2 29  ?GLUCOSE 87  ?BUN 8  ?CREATININE 0.58  ?CALCIUM 9.6  ?PROT 6.9  ?ALBUMIN 3.9  ?AST 14  ?ALT 12  ?ALKPHOS 54  ?BILITOT 0.4  ? ?Iron/TIBC/Ferritin/ %Sat ?No results found for: IRON, TIBC, FERRITIN,

## 2022-02-26 ENCOUNTER — Telehealth: Payer: Self-pay | Admitting: *Deleted

## 2022-02-26 ENCOUNTER — Other Ambulatory Visit: Payer: Self-pay

## 2022-02-26 DIAGNOSIS — Z86718 Personal history of other venous thrombosis and embolism: Secondary | ICD-10-CM

## 2022-02-26 DIAGNOSIS — I8391 Asymptomatic varicose veins of right lower extremity: Secondary | ICD-10-CM

## 2022-02-26 NOTE — Telephone Encounter (Signed)
Received referral to genetics for family h/o breast cancer. I contacted the patient and scheduled pt for 3/30 at 11am. ?

## 2022-03-03 ENCOUNTER — Other Ambulatory Visit: Payer: Self-pay

## 2022-03-05 ENCOUNTER — Other Ambulatory Visit: Payer: Self-pay

## 2022-03-06 ENCOUNTER — Inpatient Hospital Stay (HOSPITAL_BASED_OUTPATIENT_CLINIC_OR_DEPARTMENT_OTHER): Payer: No Typology Code available for payment source | Admitting: Licensed Clinical Social Worker

## 2022-03-06 ENCOUNTER — Inpatient Hospital Stay: Payer: No Typology Code available for payment source

## 2022-03-06 ENCOUNTER — Encounter: Payer: Self-pay | Admitting: Licensed Clinical Social Worker

## 2022-03-06 DIAGNOSIS — Z800 Family history of malignant neoplasm of digestive organs: Secondary | ICD-10-CM | POA: Diagnosis not present

## 2022-03-06 DIAGNOSIS — Z806 Family history of leukemia: Secondary | ICD-10-CM | POA: Diagnosis not present

## 2022-03-06 DIAGNOSIS — Z803 Family history of malignant neoplasm of breast: Secondary | ICD-10-CM | POA: Diagnosis not present

## 2022-03-06 NOTE — Progress Notes (Signed)
REFERRING PROVIDER: ?Earlie Server, MD ?[place],  [place] [address] ? ?PRIMARY PROVIDER:  ?Tawnya Crook, MD ? ?PRIMARY REASON FOR VISIT:  ?1. Family history of breast cancer   ? ? ? ?HISTORY OF PRESENT ILLNESS:   ?Courtney Hanson, a 46 y.o. female, was seen for a [facility] cancer genetics consultation at the request of Dr. Tasia Catchings due to a family history of cancer.  Courtney Hanson presents to clinic today to discuss the possibility of a hereditary predisposition to cancer, genetic testing, and to further clarify her future cancer risks, as well as potential cancer risks for family members.  ? ?Courtney Hanson is a 46 y.o. female with no personal history of cancer.   ? ?CANCER HISTORY:  ?Oncology History  ? No history exists.  ? ? ? ?RISK FACTORS:  ?Menarche was at age 18.  ?First live birth at age 85.  ?OCP use for approximately 5 years.  ?Ovaries intact: yes.  ?Hysterectomy: no.  ?Menopausal status: premenopausal.  ?HRT use: 0 years. ?Colonoscopy: yes; normal. ?Mammogram within the last year: yes. ?Number of breast biopsies: 0. ?Any excessive radiation exposure in the past: no ? ?Past Medical History:  ?Diagnosis Date  ? Anxiety   ? GERD (gastroesophageal reflux disease)   ? Lower extremity edema   ? Pulmonary embolism ([place])   ? ? ?Past Surgical History:  ?Procedure Laterality Date  ? CHOLECYSTECTOMY, LAPAROSCOPIC N/A 2003  ? COLONOSCOPY WITH PROPOFOL N/A 10/17/2021  ? Procedure: COLONOSCOPY WITH PROPOFOL;  Surgeon: Lin Landsman, MD;  Location: [facility] ENDOSCOPY;  Service: Gastroenterology;  Laterality: N/A;  ? DILITATION & CURRETTAGE/HYSTROSCOPY WITH ESSURE    ? ESOPHAGOGASTRODUODENOSCOPY N/A 10/17/2021  ? Procedure: ESOPHAGOGASTRODUODENOSCOPY (EGD);  Surgeon: Lin Landsman, MD;  Location: [facility] ENDOSCOPY;  Service: Gastroenterology;  Laterality: N/A;  ? LEEP N/A 2003  ? ? ?Social History  ? ?Socioeconomic History  ? Marital status: Single  ?  Spouse name: Not on file  ? Number of children: 4  ? Years of  education: Not on file  ? Highest education level: Not on file  ?Occupational History  ? Not on file  ?Tobacco Use  ? Smoking status: Never  ? Smokeless tobacco: Never  ?Vaping Use  ? Vaping Use: Never used  ?Substance and Sexual Activity  ? Alcohol use: Not Currently  ? Drug use: Never  ? Sexual activity: Yes  ?  Birth control/protection: Surgical  ?  Comment: essure  ?Other Topics Concern  ? Not on file  ?Social History Narrative  ? 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 ?Social Determinants of Health  ? ?Financial Resource Strain: Not on file  ?Food Insecurity: Not on file  ?Transportation Needs: Not on file  ?Physical Activity: Not on file  ?Stress: Not on file  ?Social Connections: Not on file  ?  ? ?FAMILY HISTORY:  ?We obtained a detailed, 4-generation family history.  Significant diagnoses are listed below: ?Family History  ?Problem Relation Age of Onset  ? Hyperlipidemia Mother   ? Arthritis Mother   ? Hypertension Mother   ? Diabetes Mother   ? Hyperlipidemia Father   ? Alcohol abuse Father   ? Arthritis Father   ? Hypertension Father   ? Breast cancer Sister   ? Alcohol abuse Maternal Grandfather   ? Diabetes Paternal Grandmother   ? Cancer Paternal Grandfather   ? Birth defects Paternal Grandfather   ? Alcohol abuse Paternal Grandfather   ? ?Courtney Hanson has 3 daughters (  28, 24, 20) and 1 son (96). She has 1 full sister, 1 paternal half brother, 1 maternal half brother. Her sister had breast cancer at 75 and is living at 38, she has not had genetic testing.  ? ?Courtney Hanson's mother is living at 73 with no cancer history. A maternal cousin had leukemia and died at 38. No other known cancers on this side of the family. ? ?Courtney Hanson's father is living at 46 with no cancer history. Patient had a paternal aunt who had breast cancer, unknown age at diagnosis, an uncle who recently passed of stomach cancer, and another uncle who had cancer, unknown type. Paternal grandfather died of throat cancer in his  86s, he had history of smoking. ? ?Courtney Hanson is unaware of previous family history of genetic testing for hereditary cancer risks. There is no reported Ashkenazi Jewish ancestry. There is no known consanguinity. ? ? ? ?GENETIC COUNSELING ASSESSMENT: Courtney Hanson is a 46 y.o. female with a family history of breast cancer which is somewhat suggestive of a hereditary cancer syndrome and predisposition to cancer. We, therefore, discussed and recommended the following at today's visit.  ? ?DISCUSSION: We discussed that approximately 10% of breast cancer is hereditary. Most cases of hereditary breast cancer are associated with BRCA1/BRCA2 genes, although there are other genes associated with hereditary cancer as well. Cancers and risks are gene specific.  We discussed that testing is beneficial for several reasons including knowing about other cancer risks, identifying potential screening and risk-reduction options that may be appropriate, and to understand if other family members could be at risk for cancer and allow them to undergo genetic testing.  ? ?We reviewed the characteristics, features and inheritance patterns of hereditary cancer syndromes. We also discussed genetic testing, including the appropriate family members to test, the process of testing, insurance coverage and turn-around-time for results. We discussed the implications of a negative, positive and/or variant of uncertain significant result. We recommended Courtney Hanson pursue genetic testing for the [facility] CustomNext+RNA gene panel.  ? ?The CustomNext-Cancer+RNAinsight panel offered by [facility] includes sequencing and rearrangement analysis for the following 47 genes:  APC, ATM, AXIN2, BARD1, BMPR1A, BRCA1, BRCA2, BRIP1, CDH1, CDK4, CDKN2A, CHEK2, DICER1, EPCAM, GREM1, HOXB13, MEN1, MLH1, MSH2, MSH3, MSH6, MUTYH, NBN, NF1, NF2, NTHL1, PALB2, PMS2, POLD1, POLE, PTEN, RAD51C, RAD51D, RECQL, RET, SDHA, SDHAF2, SDHB, SDHC, SDHD, SMAD4, SMARCA4, STK11,  TP53, TSC1, TSC2, and VHL.  RNA data is routinely analyzed for use in variant interpretation for all genes. ? ?Based on Courtney Hanson's family history of cancer, she meets medical criteria for genetic testing. Despite that she meets criteria, she may still have an out of pocket cost. We discussed that if her out of pocket cost for testing is over $100, the laboratory will call and confirm whether she wants to proceed with testing.  If the out of pocket cost of testing is less than $100 she will be billed by the genetic testing laboratory.  ? ?PLAN: After considering the risks, benefits, and limitations, Courtney Hanson provided informed consent to pursue genetic testing and the blood sample was sent to [facility] for analysis of the CustomNext+RNA panel. Results should be available within approximately 2-3 weeks' time, at which point they will be disclosed by telephone to Courtney Hanson, as will any additional recommendations warranted by these results. Courtney Hanson will receive a summary of her genetic counseling visit and a copy of her results once available. This information will also be available in Epic.  ? ?  Courtney Hanson questions were answered to her satisfaction today. Our contact information was provided should additional questions or concerns arise. Thank you for the referral and allowing [place] to share in the care of your patient.  ? ?Faith Rogue, MS, LCGC ?Genetic Counselor ?[email or web address] ?Phone: [phone] ? ?The patient was seen for a total of 25 minutes in face-to-face genetic counseling.  Dr. Grayland Ormond was available for discussion regarding this case.  ? ?_______________________________________________________________________ ?For Office Staff:  ?Number of people involved in session: 1 ?Was an Intern/ student involved with case: no ? ?

## 2022-03-11 ENCOUNTER — Other Ambulatory Visit: Payer: Self-pay

## 2022-03-11 ENCOUNTER — Ambulatory Visit (INDEPENDENT_AMBULATORY_CARE_PROVIDER_SITE_OTHER): Payer: No Typology Code available for payment source

## 2022-03-11 ENCOUNTER — Ambulatory Visit (INDEPENDENT_AMBULATORY_CARE_PROVIDER_SITE_OTHER): Payer: No Typology Code available for payment source | Admitting: Podiatry

## 2022-03-11 DIAGNOSIS — M779 Enthesopathy, unspecified: Secondary | ICD-10-CM

## 2022-03-11 DIAGNOSIS — M7662 Achilles tendinitis, left leg: Secondary | ICD-10-CM

## 2022-03-11 DIAGNOSIS — M6740 Ganglion, unspecified site: Secondary | ICD-10-CM

## 2022-03-17 ENCOUNTER — Other Ambulatory Visit: Payer: Self-pay | Admitting: Family Medicine

## 2022-03-17 ENCOUNTER — Ambulatory Visit (INDEPENDENT_AMBULATORY_CARE_PROVIDER_SITE_OTHER): Payer: No Typology Code available for payment source

## 2022-03-17 ENCOUNTER — Ambulatory Visit (INDEPENDENT_AMBULATORY_CARE_PROVIDER_SITE_OTHER): Payer: No Typology Code available for payment source | Admitting: Family Medicine

## 2022-03-17 ENCOUNTER — Encounter: Payer: Self-pay | Admitting: Family Medicine

## 2022-03-17 ENCOUNTER — Other Ambulatory Visit: Payer: Self-pay

## 2022-03-17 VITALS — BP 104/74 | HR 100 | Temp 98.1°F | Ht 62.0 in | Wt 339.8 lb

## 2022-03-17 DIAGNOSIS — M25551 Pain in right hip: Secondary | ICD-10-CM

## 2022-03-17 DIAGNOSIS — Z6841 Body Mass Index (BMI) 40.0 and over, adult: Secondary | ICD-10-CM

## 2022-03-17 DIAGNOSIS — Z0000 Encounter for general adult medical examination without abnormal findings: Secondary | ICD-10-CM

## 2022-03-17 MED ORDER — WEGOVY 1 MG/0.5ML ~~LOC~~ SOAJ
1.0000 mg | SUBCUTANEOUS | 0 refills | Status: DC
  Filled 2022-03-17 – 2022-03-20 (×2): qty 2, 28d supply, fill #0

## 2022-03-17 NOTE — Progress Notes (Signed)
?Phone [phone] ?  ?Subjective:  ? ?Patient is a 46 y.o. female presenting for annual physical.   ?Mancel Parsons helping and has made a lot of changes TLC-no SE ? ? ?Chief Complaint  ?Patient presents with  ? Follow-up  ? ? ?See problem oriented charting- ?ROS- ROS: ?Gen: no fever, chills  ?Skin: no rash, itching ?ENT: no ear pain, ear drainage, nasal congestion, rhinorrhea, sinus pressure, sore throat ?Eyes: no blurry vision, double vision ?Resp: no cough, wheeze,SOB ?CV: no CP, palpitations, LE edema,  ?GI: no heartburn, n/v/d/c, abd pain ?GU: no dysuria, urgency, frequency, hematuria.  LMP 1 wk ago-irreg..  some hot flashes. ESSURE. ?MSK: L knee pain at times.  R groin-spasm at times. When walks.  Can get bad.  ?Neuro: no dizziness, headache, weakness, vertigo ?Psych: no depression, anxiety, insomnia, SI  ? ?The following were reviewed and entered/updated in epic: ?Past Medical History:  ?Diagnosis Date  ? Anxiety   ? GERD (gastroesophageal reflux disease)   ? Lower extremity edema   ? Pulmonary embolism ([place])   ? ?Patient Active Problem List  ? Diagnosis Date Noted  ? History of pulmonary embolism 02/25/2022  ? Family history of breast cancer 02/25/2022  ? History of DVT of lower extremity 02/25/2022  ? Pulmonary embolism ([place]) 02/11/2022  ? Primary insomnia 02/11/2022  ? Localized edema 02/11/2022  ? Chronic diarrhea   ? Adenomatous rectal polyp   ? Nausea without vomiting   ? Chronic GERD   ? Erosive gastritis   ? Prediabetes 10/18/2020  ? Chronic right-sided thoracic back pain 09/23/2019  ? Vitamin D deficiency 06/04/2017  ? Deep venous thrombosis of lower extremity ([place]) 06/19/2016  ? Tachycardia 06/19/2016  ? Major depressive disorder, recurrent episode, moderate ([place]) 07/13/2015  ? PMDD (premenstrual dysphoric disorder) 12/21/2013  ? GAD (generalized anxiety disorder) 06/24/2013  ? Morbid obesity with BMI of 60.0-69.9, adult ([place]) 06/24/2013  ? Endometritis 05/31/2013  ? ?Past Surgical History:  ?Procedure  Laterality Date  ? CHOLECYSTECTOMY, LAPAROSCOPIC N/A 2003  ? COLONOSCOPY WITH PROPOFOL N/A 10/17/2021  ? Procedure: COLONOSCOPY WITH PROPOFOL;  Surgeon: Lin Landsman, MD;  Location: [facility] ENDOSCOPY;  Service: Gastroenterology;  Laterality: N/A;  ? DILITATION & CURRETTAGE/HYSTROSCOPY WITH ESSURE    ? ESOPHAGOGASTRODUODENOSCOPY N/A 10/17/2021  ? Procedure: ESOPHAGOGASTRODUODENOSCOPY (EGD);  Surgeon: Lin Landsman, MD;  [facility] ENDOSCOPY;  Service: Gastroenterology;  Laterality: N/A;  ? LEEP N/A 2003  ? ? ?Family History  ?Problem Relation Age of Onset  ? Hyperlipidemia Mother   ? Arthritis Mother   ? Hypertension Mother   ? Diabetes Mother   ? Hyperlipidemia Father   ? Alcohol abuse Father   ? Arthritis Father   ? Hypertension Father   ? Breast cancer Sister 13  ? Breast cancer Paternal Aunt   ? Stomach cancer Paternal Uncle   ? Cancer Paternal Uncle   ?     unk type  ? Alcohol abuse Maternal Grandfather   ? Diabetes Paternal Grandmother   ? Throat cancer Paternal Grandfather   ?     dx. 72s  ? Birth defects Paternal Grandfather   ? Alcohol abuse Paternal Grandfather   ? ? ?Medications- reviewed and updated ?Current Outpatient Medications  ?Medication Sig Dispense Refill  ? albuterol (VENTOLIN HFA) 108 (90 Base) MCG/ACT inhaler SMARTSIG:2 Puff(s) By Mouth Every 6 Hours PRN    ? ALPRAZolam (XANAX) 0.5 MG tablet Take 1 tablet (0.5 mg total) by mouth 2 (two) times daily as needed. 30 tablet  1  ? apixaban (ELIQUIS) 2.5 MG TABS tablet Take 1 tablet (2.5 mg total) by mouth 2 (two) times daily. 180 tablet 1  ? cyclobenzaprine (FLEXERIL) 5 MG tablet Take by mouth as needed.    ? esomeprazole (NEXIUM) 40 MG capsule Take 1 capsule (40 mg total) by mouth daily before breakfast. 30 capsule 3  ? furosemide (LASIX) 20 MG tablet Take 1 tablet (20 mg total) by mouth as needed. 30 tablet 3  ? potassium chloride (MICRO-K) 10 MEQ CR capsule Take 2 capsules (20 mEq total) by mouth as needed. 30 capsule 3  ?  promethazine (PHENERGAN) 25 MG tablet Take 1 tablet (25 mg total) by mouth every six (6) hours as needed for nausea. 30 tablet 2  ? Semaglutide-Weight Management (WEGOVY) 1 MG/0.5ML SOAJ Inject 1 mg into the skin once a week. 2 mL 0  ? zolpidem (AMBIEN CR) 6.25 MG CR tablet TAKE 1 TABLET BY MOUTH NIGHTLY AS NEEDED FOR SLEEP 30 tablet 0  ? ?No current facility-administered medications for this visit.  ? ? ?Allergies-reviewed and updated ?Allergies  ?Allergen Reactions  ? Tetanus Toxoid Dermatitis, Hives, Itching and Rash  ? Topiramate   ?  Shaking hands & whoozy  ? Venlafaxine   ?  Elevation of blood pressure  ? ? ?Social History  ? ?Social History Narrative  ? 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 ?Objective  ?Objective:  ?BP 104/74   Pulse 100   Temp 98.1 ?F (36.7 ?C)   Ht '5\' 2"'$  (1.575 m)   Wt (!) 339 lb 12.8 oz (154.1 kg)   SpO2 98%   BMI 62.15 kg/m?  ?Physical Exam  ?Gen: WDWN NAD. MO ?HEENT: NCAT, conjunctiva not injected, sclera nonicteric ?TM WNL B, OP moist, no exudates  ?NECK:  supple, no thyromegaly, no nodes, no carotid bruits ?CARDIAC: RRR, S1S2+, no murmur. DP 2+B ?LUNGS: CTAB. No wheezes ?ABDOMEN:  BS+, soft, NTND, No HSM, no masses ?EXT:  no edema ?MSK: no gross abnormalities. Ms 5/5 all 4.  Some pain w/rotation R hip and palpation groin. ?NEURO: A&O x3.  CN II-XII intact.  ?PSYCH: normal mood. Good eye contact  ?  ?Assessment and Plan  ? ?Health Maintenance counseling: ?1. Anticipatory guidance: Patient counseled regarding regular dental exams q6 months, eye exams,  avoiding smoking and second hand smoke, limiting alcohol to 1 beverage per day, no illicit drugs.   ?2. Risk factor reduction:  Advised patient of need for regular exercise and diet rich and fruits and vegetables to reduce risk of heart attack and stroke. Exercise- working on it.  ?Wt Readings from Last 3 Encounters:  ?03/17/22 (!) 339 lb 12.8 oz (154.1 kg)  ?02/11/22 (!) 349 lb (158.3 kg)  ?10/30/21 (!) 330 lb 0.5 oz (149.7  kg)  ? ?3. Immunizations/screenings/ancillary studies ?Immunization History  ?Administered Date(s) Administered  ? Influenza-Unspecified 10/07/2017  ? Td 09/08/1990  ? ?Health Maintenance Due  ?Topic Date Due  ? Hepatitis C Screening  Never done  ? PAP SMEAR-Modifier  05/14/2022  ?  ?4. Cervical cancer screening- UTD ?5. Breast cancer screening-  mammogram utd ?6. Colon cancer screening - utd ?7. Skin cancer screening- advised regular sunscreen use. Denies worrisome, changing, or new skin lesions.  ?8. Birth control/STD check- essure ?9. Osteoporosis screening- n/a ?10. Smoking associated screening - non smoker ? ?Problem List Items Addressed This Visit   ? ?  ? Other  ? Morbid obesity with BMI of 60.0-69.9, adult ([place])  ? Relevant Medications  ?  Semaglutide-Weight Management (WEGOVY) 1 MG/0.5ML SOAJ  ? ?Other Visit Diagnoses   ? ? Wellness examination    -  Primary  ? Pain of right hip      ? Relevant Orders  ? DG Hip Unilat W OR W/O Pelvis 1V Right  ? ?  ? ? ?Recommended follow up: 3 Return in about 3 months (around 06/16/2022) for wt. ?Future Appointments  ?Date Time Provider [place]  ?04/11/2022  2:15 PM Posey Pronto Thomasene Lot, DPM [facility]  ?06/27/2022 11:00 AM Tawnya Crook, MD [facility]  ?08/26/2022 10:00 AM [facility] None  ?08/26/2022 10:30 AM Earlie Server, MD [facility] None  ? ? ?Lab/Order associations:n/a fasting ?  ICD-10-CM   ?1. Wellness examination  Z00.00   ?  ?2. Pain of right hip  M25.551 DG Hip Unilat W OR W/O Pelvis 1V Right  ?  ?3. Morbid obesity with BMI of 60.0-69.9, adult ([place])  E66.01   ? Z68.44   ?  ? Wellness-antic guidance.  Labs ok.  Cont TLC ?Pain R hip-check x-ray.  Stretches.  If worse, continues, refer PT/sports med(declines for now) ?Morbid obesity-working on TLC-has lost 10#.  Also on Wegovy-increase to '1mg'$  dose. ? ?Meds ordered this encounter  ?Medications  ? Semaglutide-Weight Management (WEGOVY) 1 MG/0.5ML SOAJ  ?  Sig: Inject 1 mg into the skin once a week.   ?  Dispense:  2 mL  ?  Refill:  0  ? ? ?Wellington [place], MD ? ? ?

## 2022-03-17 NOTE — Patient Instructions (Signed)
It was very nice to see you today! ? ?Awesome job on weight ? ? ?PLEASE NOTE: ? ?If you had any lab tests please let us know if you have not heard back within a few days. You may see your results on MyChart before we have a chance to review them but we will give you a call once they are reviewed by [place]. If we ordered any referrals today, please let us know if you have not heard from their office within the next week.  ? ?Please try these tips to maintain a healthy lifestyle: ? ?Eat most of your calories during the day when you are active. Eliminate processed foods including packaged sweets (pies, cakes, cookies), reduce intake of potatoes, white bread, white pasta, and white rice. Look for whole grain options, oat flour or almond flour. ? ?Each meal should contain half fruits/vegetables, one quarter protein, and one quarter carbs (no bigger than a computer mouse). ? ?Cut down on sweet beverages. This includes juice, soda, and sweet tea. Also watch fruit intake, though this is a healthier sweet option, it still contains natural sugar! Limit to 3 servings daily. ? ?Drink at least 1 glass of water with each meal and aim for at least 8 glasses per day ? ?Exercise at least 150 minutes every week.   ?

## 2022-03-18 ENCOUNTER — Encounter: Payer: Self-pay | Admitting: Podiatry

## 2022-03-18 ENCOUNTER — Encounter: Payer: Self-pay | Admitting: Licensed Clinical Social Worker

## 2022-03-18 ENCOUNTER — Ambulatory Visit: Payer: Self-pay | Admitting: Licensed Clinical Social Worker

## 2022-03-18 ENCOUNTER — Telehealth: Payer: Self-pay | Admitting: Licensed Clinical Social Worker

## 2022-03-18 DIAGNOSIS — Z1379 Encounter for other screening for genetic and chromosomal anomalies: Secondary | ICD-10-CM | POA: Insufficient documentation

## 2022-03-18 NOTE — Telephone Encounter (Signed)
Revealed negative genetic testing.  This normal result is reassuring.  It is unlikely that there is an increased risk of cancer due to a mutation in one of these genes.  However, genetic testing is not perfect, and cannot definitively rule out a hereditary cause.  It will be important for her to keep in contact with genetics to learn if any additional testing may be needed in the future.      

## 2022-03-18 NOTE — Progress Notes (Signed)
HPI:  Ms. Arno was previously seen in the [place] clinic due to a family history of cancer and concerns regarding a hereditary predisposition to cancer. Please refer to our prior cancer genetics clinic note for more information regarding our discussion, assessment and recommendations, at the time. Ms. Dossantos recent genetic test results were disclosed to her, as were recommendations warranted by these results. These results and recommendations are discussed in more detail below. ? ?CANCER HISTORY:  ?Oncology History  ? No history exists.  ? ? ?FAMILY HISTORY:  ?We obtained a detailed, 4-generation family history.  Significant diagnoses are listed below: ?Family History  ?Problem Relation Age of Onset  ? Hyperlipidemia Mother   ? Arthritis Mother   ? Hypertension Mother   ? Diabetes Mother   ? Hyperlipidemia Father   ? Alcohol abuse Father   ? Arthritis Father   ? Hypertension Father   ? Breast cancer Sister 12  ? Breast cancer Paternal Aunt   ? Stomach cancer Paternal Uncle   ? Cancer Paternal Uncle   ?     unk type  ? Alcohol abuse Maternal Grandfather   ? Diabetes Paternal Grandmother   ? Throat cancer Paternal Grandfather   ?     dx. 49s  ? Birth defects Paternal Grandfather   ? Alcohol abuse Paternal Grandfather   ? ?Ms. Honeyman has 3 daughters (28, 64, 62) and 1 son (26). She has 1 full sister, 1 paternal half brother, 1 maternal half brother. Her sister had breast cancer at 41 and is living at 46, she has not had genetic testing.  ?  ?Ms. Kearn's mother is living at 66 with no cancer history. A maternal cousin had leukemia and died at 69. No other known cancers on this side of the family. ?  ?Ms. Dikes's father is living at 73 with no cancer history. Patient had a paternal aunt who had breast cancer, unknown age at diagnosis, an uncle who recently passed of stomach cancer, and another uncle who had cancer, unknown type. Paternal grandfather died of throat cancer in his 59s, he had  history of smoking. ?  ?Ms. Kristiansen is unaware of previous family history of genetic testing for hereditary cancer risks. There is no reported Ashkenazi Jewish ancestry. There is no known consanguinity. ?  ? ? ? ?GENETIC TEST RESULTS: Genetic testing reported out on 03/18/2022 through the [facility] CustomNext+RNA cancer panel found no pathogenic mutations.  ? ?The CustomNext-Cancer+RNAinsight panel offered by [facility] includes sequencing and rearrangement analysis for the following 47 genes:  APC, ATM, AXIN2, BARD1, BMPR1A, BRCA1, BRCA2, BRIP1, CDH1, CDK4, CDKN2A, CHEK2, DICER1, EPCAM, GREM1, HOXB13, MEN1, MLH1, MSH2, MSH3, MSH6, MUTYH, NBN, NF1, NF2, NTHL1, PALB2, PMS2, POLD1, POLE, PTEN, RAD51C, RAD51D, RECQL, RET, SDHA, SDHAF2, SDHB, SDHC, SDHD, SMAD4, SMARCA4, STK11, TP53, TSC1, TSC2, and VHL.  RNA data is routinely analyzed for use in variant interpretation for all genes. ? ?The test report has been scanned into EPIC and is located under the Molecular Pathology section of the Results Review tab.  A portion of the result report is included below for reference.  ? ? ? ?We discussed that because current genetic testing is not perfect, it is possible there may be a gene mutation in one of these genes that current testing cannot detect, but that chance is small.  There could be another gene that has not yet been discovered, or that we have not yet tested, that is responsible for the cancer diagnoses  in the family. It is also possible there is a hereditary cause for the cancer in the family that Ms. Murthy did not inherit and therefore was not identified in her testing.  Therefore, it is important to remain in touch with cancer genetics in the future so that we can continue to offer Ms. Mcclimans the most up to date genetic testing.  ? ?ADDITIONAL GENETIC TESTING: We discussed with Ms. Flitton that her genetic testing was fairly extensive.  If there are genes identified to increase cancer risk that can be analyzed in the  future, we would be happy to discuss and coordinate this testing at that time.   ? ?CANCER SCREENING RECOMMENDATIONS: Ms. Gronewold test result is considered negative (normal).  This means that we have not identified a hereditary cause for her  family history of cancer at this time.  ? ?While reassuring, this does not definitively rule out a hereditary predisposition to cancer. It is still possible that there could be genetic mutations that are undetectable by current technology. There could be genetic mutations in genes that have not been tested or identified to increase cancer risk.  Therefore, it is recommended she continue to follow the cancer management and screening guidelines provided by her primary healthcare provider.  ? ?An individual's cancer risk and medical management are not determined by genetic test results alone. Overall cancer risk assessment incorporates additional factors, including personal medical history, family history, and any available genetic information that may result in a personalized plan for cancer prevention and surveillance. ? ?Based on Ms. Scannell's personal and family history of cancer as well as her genetic test results, risk model Harriett Rush was used to estimate her risk of developing breast cancer. This estimates her lifetime risk of developing breast cancer to be approximately 15%.  The patient's lifetime breast cancer risk is a preliminary estimate based on available information using one of several models endorsed by the [employer]  (NCCN). The NCCN recommends consideration of breast MRI screening as an adjunct to mammography for patients at high risk (defined as 20% or greater lifetime risk).  This risk estimate can change over time and may be repeated to reflect new information in her personal or family history in the future. ? ? ? ?RECOMMENDATIONS FOR FAMILY MEMBERS:  Relatives in this family might be at some increased risk of developing cancer,  over the general population risk, simply due to the family history of cancer.  We recommended female relatives in this family have a yearly mammogram beginning at age 69, or 37 years younger than the earliest onset of cancer, an annual clinical breast exam, and perform monthly breast self-exams. Female relatives in this family should also have a gynecological exam as recommended by their primary provider.  All family members should be referred for colonoscopy starting at age 75.  ? ? It is also possible there is a hereditary cause for the cancer in Ms. Pupo's family that she did not inherit and therefore was not identified in her.  Based on Ms. Lukehart's family history, we recommended her sister have genetic counseling and testing. Ms. Guinn will let us know if we can be of any assistance in coordinating genetic counseling and/or testing for these family members. ? ?FOLLOW-UP: Lastly, we discussed with Ms. Postell that cancer genetics is a rapidly advancing field and it is possible that new genetic tests will be appropriate for her and/or her family members in the future. We encouraged her to remain in contact  with cancer genetics on an annual basis so we can update her personal and family histories and let her know of advances in cancer genetics that may benefit this family.  ? ?Our contact number was provided. Ms. Helbig questions were answered to her satisfaction, and she knows she is welcome to call us at anytime with additional questions or concerns.  ? ?Faith Rogue, MS, LCGC ?Genetic Counselor ?[email or web address] ?Phone: [phone] ? ?

## 2022-03-18 NOTE — Progress Notes (Signed)
?Subjective:  ?Patient ID: Courtney Hanson, female    DOB: Apr 26, 1976,  MRN: [id number] ? ?No chief complaint on file. ? ? ?46 y.o. female presents with the above complaint.  Patient presents with complaint of left Achilles tendinitis with underlying ganglion cyst.  Patient states painful to touch is painful to walk on.  She does have underlying equinus.  She states her pain is 6 out of 10 and has been going on for about 2 months it hurts with pressure feels like there is a knot on the back of the ankle.  She has not seen anyone else prior to seeing me.  She denies any other acute complaints.  She has tried shoe gear modification which has not helped. ? ? ?Review of Systems: Negative except as noted in the HPI. Denies N/V/F/Ch. ? ?Past Medical History:  ?Diagnosis Date  ? Anxiety   ? GERD (gastroesophageal reflux disease)   ? Lower extremity edema   ? Pulmonary embolism ([place])   ? ? ?Current Outpatient Medications:  ?  albuterol (VENTOLIN HFA) 108 (90 Base) MCG/ACT inhaler, SMARTSIG:2 Puff(s) By Mouth Every 6 Hours PRN, Disp: , Rfl:  ?  ALPRAZolam (XANAX) 0.5 MG tablet, Take 1 tablet (0.5 mg total) by mouth 2 (two) times daily as needed., Disp: 30 tablet, Rfl: 1 ?  apixaban (ELIQUIS) 2.5 MG TABS tablet, Take 1 tablet (2.5 mg total) by mouth 2 (two) times daily., Disp: 180 tablet, Rfl: 1 ?  cyclobenzaprine (FLEXERIL) 5 MG tablet, Take by mouth as needed., Disp: , Rfl:  ?  esomeprazole (NEXIUM) 40 MG capsule, Take 1 capsule (40 mg total) by mouth daily before breakfast., Disp: 30 capsule, Rfl: 3 ?  furosemide (LASIX) 20 MG tablet, Take 1 tablet (20 mg total) by mouth as needed., Disp: 30 tablet, Rfl: 3 ?  potassium chloride (MICRO-K) 10 MEQ CR capsule, Take 2 capsules (20 mEq total) by mouth as needed., Disp: 30 capsule, Rfl: 3 ?  promethazine (PHENERGAN) 25 MG tablet, Take 1 tablet (25 mg total) by mouth every six (6) hours as needed for nausea., Disp: 30 tablet, Rfl: 2 ?  Semaglutide-Weight Management (WEGOVY) 1  MG/0.5ML SOAJ, Inject 1 mg into the skin once a week., Disp: 2 mL, Rfl: 0 ?  zolpidem (AMBIEN CR) 6.25 MG CR tablet, TAKE 1 TABLET BY MOUTH NIGHTLY AS NEEDED FOR SLEEP, Disp: 30 tablet, Rfl: 0 ? ?Social History  ? ?Tobacco Use  ?Smoking Status Never  ?Smokeless Tobacco Never  ? ? ?Allergies  ?Allergen Reactions  ? Tetanus Toxoid Dermatitis, Hives, Itching and Rash  ? Topiramate   ?  Shaking hands & whoozy  ? Venlafaxine   ?  Elevation of blood pressure  ? ?Objective:  ?There were no vitals filed for this visit. ?There is no height or weight on file to calculate BMI. ?Constitutional Well developed. ?Well nourished.  ?Vascular Dorsalis pedis pulses palpable bilaterally. ?Posterior tibial pulses palpable bilaterally. ?Capillary refill normal to all digits.  ?No cyanosis or clubbing noted. ?Pedal hair growth normal.  ?Neurologic Normal speech. ?Oriented to person, place, and time. ?Epicritic sensation to light touch grossly present bilaterally.  ?Dermatologic Nails well groomed and normal in appearance. ?No open wounds. ?No skin lesions.  ?Orthopedic: Firm nodule noted within the Achilles tendon.  Pain along the Achilles tendon.  No pain at the insertion of the Achilles tendon.  Unable to appreciate a underlying Haglund's deformity.  Positive Silfverskiold test with equinus noted of the gastroc.  No pain at the  posterior tibial tendon, peroneal tendon  ? ?Radiographs: 3 views of skeletally mature adult left foot: Plantar spurring noted.  Mild posterior spurring noted.  Small os trigonum noted.  Flatfoot deformity noted.  No other bony abnormalities identified ?Assessment:  ? ?1. Achilles tendinitis, left leg   ?2. Ganglion cyst   ? ?Plan:  ?Patient was evaluated and treated and all questions answered. ? ?Left Achilles tendinitis with underlying soft tissue mass/fibroma ?-All questions and concerns were discussed with the patient in extensive detail.  Given the amount of inflammation is present I believe she will benefit  from cam boot immobilization allow the soft tissue structure to heal appropriately.  Given the size of the soft tissue mass I believe she will benefit from an MRI evaluation to evaluate the depth of the soft tissue mass for possible surgical excision.  She states understanding. ?-MRI was ordered of the ankle to assess the soft tissue mass ? ?No follow-ups on file.  ?

## 2022-03-20 ENCOUNTER — Other Ambulatory Visit: Payer: Self-pay

## 2022-03-20 ENCOUNTER — Other Ambulatory Visit: Payer: Self-pay | Admitting: Podiatry

## 2022-03-27 ENCOUNTER — Encounter: Payer: Self-pay | Admitting: Family Medicine

## 2022-03-27 ENCOUNTER — Encounter: Payer: Self-pay | Admitting: Podiatry

## 2022-03-28 ENCOUNTER — Other Ambulatory Visit: Payer: Self-pay | Admitting: *Deleted

## 2022-03-28 ENCOUNTER — Ambulatory Visit: Payer: Self-pay | Admitting: Internal Medicine

## 2022-03-28 DIAGNOSIS — R002 Palpitations: Secondary | ICD-10-CM

## 2022-03-28 DIAGNOSIS — Z0000 Encounter for general adult medical examination without abnormal findings: Secondary | ICD-10-CM

## 2022-04-11 ENCOUNTER — Ambulatory Visit: Payer: No Typology Code available for payment source | Admitting: Podiatry

## 2022-04-11 ENCOUNTER — Encounter: Payer: Self-pay | Admitting: Family Medicine

## 2022-04-13 NOTE — Telephone Encounter (Signed)
This is Dr Kathrin Ruddy patient

## 2022-04-21 ENCOUNTER — Other Ambulatory Visit: Payer: Self-pay | Admitting: Family Medicine

## 2022-04-21 MED ORDER — SERTRALINE HCL 50 MG PO TABS: 50.0000 mg | ORAL_TABLET | Freq: Every day | ORAL | 1 refills | Status: DC

## 2022-04-21 MED ORDER — WEGOVY 1.7 MG/0.75ML ~~LOC~~ SOAJ: 1.7000 mg | SUBCUTANEOUS | 0 refills | Status: DC

## 2022-04-22 ENCOUNTER — Other Ambulatory Visit: Payer: Self-pay

## 2022-04-22 MED ORDER — SERTRALINE HCL 50 MG PO TABS
50.0000 mg | ORAL_TABLET | Freq: Every day | ORAL | 1 refills | Status: DC
  Filled 2022-04-22: qty 30, 30d supply, fill #0
  Filled 2022-05-15: qty 30, 30d supply, fill #1

## 2022-04-22 MED ORDER — WEGOVY 1.7 MG/0.75ML ~~LOC~~ SOAJ
1.7000 mg | SUBCUTANEOUS | 0 refills | Status: DC
  Filled 2022-04-22: qty 3, 28d supply, fill #0

## 2022-04-22 NOTE — Addendum Note (Signed)
Addended by: Zacarias Pontes on: 04/22/2022 09:53 AM ? ? Modules accepted: Orders ? ?

## 2022-04-25 ENCOUNTER — Encounter: Payer: Self-pay | Admitting: Podiatry

## 2022-04-25 NOTE — Telephone Encounter (Signed)
Please advise Dr. Posey Pronto is on vacation.

## 2022-05-15 ENCOUNTER — Other Ambulatory Visit: Payer: Self-pay

## 2022-05-16 ENCOUNTER — Other Ambulatory Visit: Payer: Self-pay

## 2022-05-16 ENCOUNTER — Encounter: Payer: Self-pay | Admitting: Cardiology

## 2022-05-16 ENCOUNTER — Ambulatory Visit (INDEPENDENT_AMBULATORY_CARE_PROVIDER_SITE_OTHER): Payer: No Typology Code available for payment source | Admitting: Cardiology

## 2022-05-16 VITALS — BP 115/81 | HR 82 | Ht 61.0 in

## 2022-05-16 DIAGNOSIS — R002 Palpitations: Secondary | ICD-10-CM | POA: Diagnosis not present

## 2022-05-16 DIAGNOSIS — R0789 Other chest pain: Secondary | ICD-10-CM

## 2022-05-16 NOTE — Progress Notes (Signed)
Clinical Summary Courtney Hanson is a 46 y.o.female   1.Palpitations - 06/2021 monitor 7 days [facility]: One run SVT loasting 6 beats, rare PACs, rare PVCs - she reports only wore for 2-3 days due to skin rash. - ongoing episodes of palpitations, occurs few times a week. Can occur at rest or with activity, lasts just a few seconds. Some SOB  - occasional coffee, tea/lemonade daily 3 glasses, no sodas, no energy, no EtOH   2. Chest pain - started 2 night ago - squeezing left sided, 5/10 in severity. No other associated symptoms. Occurs at rest, occurs with laying down. Mildly lightheaded. Not positional. Lasts a few minutes. No relation to food  - no exertional symptoms  CAD risk factors: none    3. History of DVT/PE - recurrent DVT history - she is on eliquis  Past Medical History:  Diagnosis Date   Anxiety    GERD (gastroesophageal reflux disease)    Lower extremity edema    Pulmonary embolism ([facility])      Allergies  Allergen Reactions   Tetanus Toxoid Dermatitis, Hives, Itching and Rash   Topiramate     Shaking hands & whoozy   Venlafaxine     Elevation of blood pressure     Current Outpatient Medications  Medication Sig Dispense Refill   albuterol (VENTOLIN HFA) 108 (90 Base) MCG/ACT inhaler SMARTSIG:2 Puff(s) By Mouth Every 6 Hours PRN     ALPRAZolam (XANAX) 0.5 MG tablet Take 1 tablet (0.5 mg total) by mouth 2 (two) times daily as needed. 30 tablet 1   apixaban (ELIQUIS) 2.5 MG TABS tablet Take 1 tablet (2.5 mg total) by mouth 2 (two) times daily. 180 tablet 1   cyclobenzaprine (FLEXERIL) 5 MG tablet Take by mouth as needed.     esomeprazole (NEXIUM) 40 MG capsule Take 1 capsule (40 mg total) by mouth daily before breakfast. 30 capsule 3   furosemide (LASIX) 20 MG tablet Take 1 tablet (20 mg total) by mouth as needed. 30 tablet 3   potassium chloride (MICRO-K) 10 MEQ CR capsule Take 2 capsules (20 mEq total) by mouth as needed. 30 capsule 3   promethazine  (PHENERGAN) 25 MG tablet Take 1 tablet (25 mg total) by mouth every six (6) hours as needed for nausea. 30 tablet 2   Semaglutide-Weight Management (WEGOVY) 1 MG/0.5ML SOAJ Inject 1 mg into the skin once a week. 2 mL 0   Semaglutide-Weight Management (WEGOVY) 1.7 MG/0.75ML SOAJ Inject 1.7 mg into the skin once a week. 3 mL 0   sertraline (ZOLOFT) 50 MG tablet Take 1 tablet (50 mg total) by mouth daily. 30 tablet 1   zolpidem (AMBIEN CR) 6.25 MG CR tablet TAKE 1 TABLET BY MOUTH NIGHTLY AS NEEDED FOR SLEEP 30 tablet 0   No current facility-administered medications for this visit.     Past Surgical History:  Procedure Laterality Date   CHOLECYSTECTOMY, LAPAROSCOPIC N/A 2003   COLONOSCOPY WITH PROPOFOL N/A 10/17/2021   Procedure: COLONOSCOPY WITH PROPOFOL;  Surgeon: Lin Landsman, MD;  Location: [facility] ENDOSCOPY;  Service: Gastroenterology;  Laterality: N/A;   DILITATION & CURRETTAGE/HYSTROSCOPY WITH ESSURE     ESOPHAGOGASTRODUODENOSCOPY N/A 10/17/2021   Procedure: ESOPHAGOGASTRODUODENOSCOPY (EGD);  Surgeon: Lin Landsman, MD;  Location: [facility] ENDOSCOPY;  Service: Gastroenterology;  Laterality: N/A;   LEEP N/A 2003     Allergies  Allergen Reactions   Tetanus Toxoid Dermatitis, Hives, Itching and Rash   Topiramate     Shaking hands &  whoozy   Venlafaxine     Elevation of blood pressure      Family History  Problem Relation Age of Onset   Hyperlipidemia Mother    Arthritis Mother    Hypertension Mother    Diabetes Mother    Hyperlipidemia Father    Alcohol abuse Father    Arthritis Father    Hypertension Father    Breast cancer Sister 37   Breast cancer Paternal Aunt    Stomach cancer Paternal Uncle    Cancer Paternal Uncle        unk type   Alcohol abuse Maternal Grandfather    Diabetes Paternal Grandmother    Throat cancer Paternal Grandfather        dx. 73s   Birth defects Paternal Grandfather    Alcohol abuse Paternal Grandfather      Social  History Ms. Busk reports that she has never smoked. She has never used smokeless tobacco. Ms. Paradise reports that she does not currently use alcohol.   Review of Systems CONSTITUTIONAL: No weight loss, fever, chills, weakness or fatigue.  HEENT: Eyes: No visual loss, blurred vision, double vision or yellow sclerae.No hearing loss, sneezing, congestion, runny nose or sore throat.  SKIN: No rash or itching.  CARDIOVASCULAR: per hpi RESPIRATORY: No shortness of breath, cough or sputum.  GASTROINTESTINAL: No anorexia, nausea, vomiting or diarrhea. No abdominal pain or blood.  GENITOURINARY: No burning on urination, no polyuria NEUROLOGICAL: No headache, dizziness, syncope, paralysis, ataxia, numbness or tingling in the extremities. No change in bowel or bladder control.  MUSCULOSKELETAL: No muscle, back pain, joint pain or stiffness.  LYMPHATICS: No enlarged nodes. No history of splenectomy.  PSYCHIATRIC: No history of depression or anxiety.  ENDOCRINOLOGIC: No reports of sweating, cold or heat intolerance. No polyuria or polydipsia.  Marland Kitchen   Physical Examination Today's Vitals   05/16/22 1502  BP: 115/81  Pulse: 82  SpO2: 97%  Height: '5\' 1"'$  (1.549 m)   Body mass index is 64.2 kg/m.  Gen: resting comfortably, no acute distress HEENT: no scleral icterus, pupils equal round and reactive, no palptable cervical adenopathy,  CV: RRR, no mr/g no jvd Resp: Clear to auscultation bilaterally GI: abdomen is soft, non-tender, non-distended, normal bowel sounds, no hepatosplenomegaly MSK: extremities are warm, no edema.  Skin: warm, no rash Neuro:  no focal deficits Psych: appropriate affect   Diagnostic Studies  06/2021 echo [facility] Summary    1. The left ventricle is normal in size with normal wall thickness.    2. The left ventricular systolic function is normal, LVEF is visually  estimated at > 55%.    3. The right ventricle is normal in size, with normal systolic function.    4.  There is no pulmonary hypertension, estimated pulmonary artery systolic  pressure is 23 mmHg.   06/2021 7day zio patch [facility] Patient had a min HR of 63 bpm, max HR of 167 bpm, and avg HR of 100  bpm. Predominant underlying rhythm was Sinus Rhythm. 1 run of  Supraventricular Tachycardia occurred lasting 6 beats with a max rate of  167 bpm (avg 154 bpm). Isolated SVEs were rare (<1.0%, 59), SVE  Couplets were rare (<1.0%, 4), and SVE Triplets were rare (<1.0%, 2).  Isolated VEs were rare (<1.0%, 98), and no VE Couplets or VE Triplets  were present.     Assessment and Plan  Palpitations - benign monitor last year, rare ectopy isolated 6 beat runs SVT - symptoms mild currently, at  this time she is not in favor of starting medication given mild symptoms - if progression could try lopressor 12.'5mg'$  bid  2. Chest pain - not consistent with cardiac chest pain. The fact mainly happens with laying down at night would suggest GERD. She is taking PPI just prn, discussed taking daily and monitoring symptoms - EKG today shows NSR, no ischemic changes.    F/u 6 months   Arnoldo Lenis, M.D.

## 2022-05-16 NOTE — Patient Instructions (Signed)
Medication Instructions:  Your physician recommends that you continue on your current medications as directed. Please refer to the Current Medication list given to you today.   Labwork: None  Testing/Procedures: None  Follow-Up: 6 months  Any Other Special Instructions Will Be Listed Below (If Applicable).     If you need a refill on your cardiac medications before your next appointment, please call your pharmacy.   

## 2022-05-23 ENCOUNTER — Other Ambulatory Visit: Payer: Self-pay | Admitting: Family Medicine

## 2022-05-23 ENCOUNTER — Other Ambulatory Visit: Payer: Self-pay

## 2022-05-25 ENCOUNTER — Other Ambulatory Visit: Payer: Self-pay

## 2022-05-25 MED FILL — Semaglutide (Weight Mngmt) Soln Auto-Injector 2.4 MG/0.75ML: SUBCUTANEOUS | 28 days supply | Qty: 3 | Fill #0 | Status: AC

## 2022-05-26 ENCOUNTER — Other Ambulatory Visit: Payer: Self-pay

## 2022-05-27 ENCOUNTER — Other Ambulatory Visit: Payer: Self-pay

## 2022-05-28 ENCOUNTER — Other Ambulatory Visit: Payer: Self-pay

## 2022-06-18 ENCOUNTER — Other Ambulatory Visit: Payer: Self-pay | Admitting: Family Medicine

## 2022-06-18 ENCOUNTER — Other Ambulatory Visit: Payer: Self-pay | Admitting: Gastroenterology

## 2022-06-18 DIAGNOSIS — K219 Gastro-esophageal reflux disease without esophagitis: Secondary | ICD-10-CM

## 2022-06-19 ENCOUNTER — Other Ambulatory Visit: Payer: Self-pay

## 2022-06-19 MED ORDER — SERTRALINE HCL 50 MG PO TABS
50.0000 mg | ORAL_TABLET | Freq: Every day | ORAL | 1 refills | Status: DC
  Filled 2022-06-19: qty 30, 30d supply, fill #0
  Filled 2022-07-17: qty 30, 30d supply, fill #1

## 2022-06-20 ENCOUNTER — Other Ambulatory Visit: Payer: Self-pay

## 2022-06-24 ENCOUNTER — Other Ambulatory Visit: Payer: Self-pay

## 2022-06-27 ENCOUNTER — Ambulatory Visit: Payer: Medicaid Other | Admitting: Family Medicine

## 2022-07-04 ENCOUNTER — Encounter: Payer: Self-pay | Admitting: Family Medicine

## 2022-07-04 NOTE — Telephone Encounter (Signed)
Noted  

## 2022-07-05 ENCOUNTER — Emergency Department (HOSPITAL_BASED_OUTPATIENT_CLINIC_OR_DEPARTMENT_OTHER)
Admission: EM | Admit: 2022-07-05 | Discharge: 2022-07-05 | Discharge status: Against Medical Advice | Payer: No Typology Code available for payment source

## 2022-07-05 ENCOUNTER — Telehealth: Payer: No Typology Code available for payment source | Admitting: Physician Assistant

## 2022-07-05 DIAGNOSIS — U071 COVID-19: Secondary | ICD-10-CM

## 2022-07-05 NOTE — Patient Instructions (Signed)
Due to your symptoms and history, please go to the ER or urgent care for evaluation of your recent chest pain and current COVID infection.

## 2022-07-05 NOTE — Progress Notes (Signed)
Virtual Visit Consent   Courtney Hanson, you are scheduled for a virtual visit with a [facility] provider today. Just as with appointments in the office, your consent must be obtained to participate. Your consent will be active for this visit and any virtual visit you may have with one of our providers in the next 365 days. If you have a MyChart account, a copy of this consent can be sent to you electronically.  As this is a virtual visit, video technology does not allow for your provider to perform a traditional examination. This may limit your provider's ability to fully assess your condition. If your provider identifies any concerns that need to be evaluated in person or the need to arrange testing (such as labs, EKG, etc.), we will make arrangements to do so. Although advances in technology are sophisticated, we cannot ensure that it will always work on either your end or our end. If the connection with a video visit is poor, the visit may have to be switched to a telephone visit. With either a video or telephone visit, we are not always able to ensure that we have a secure connection.  By engaging in this virtual visit, you consent to the provision of healthcare and authorize for your insurance to be billed (if applicable) for the services provided during this visit. Depending on your insurance coverage, you may receive a charge related to this service.  I need to obtain your verbal consent now. Are you willing to proceed with your visit today? Courtney Hanson has provided verbal consent on 07/05/2022 for a virtual visit (video or telephone). Inda Coke, [place]  Date: 07/05/2022 10:58 AM  Virtual Visit via Video Note   I, Inda Coke, connected with  Courtney Hanson  ([id number], [birth year]) on 07/05/22 at 10:45 AM EDT by a video-enabled telemedicine application and verified that I am speaking with the correct person using two identifiers.  Location: Patient: Virtual Visit Location Patient:  Home Provider: Virtual Visit Location Provider: Home Office   I discussed the limitations of evaluation and management by telemedicine and the availability of in person appointments. The patient expressed understanding and agreed to proceed.    History of Present Illness: Courtney Hanson is a 46 y.o. who identifies as a female who was assigned female at birth, and is being seen today for COVID-19.  Symptoms started yesterday - her first symptom was actually chest pain. Following the chest pain, she had onset of flu-like symptoms. She developed muscle aches, temperature of 101, cough. She "doesn't feel right."   She has never been vaccinated for COVID and she has never had COVID.  She has history of DVT and PE, currently taking Eliquis 2.5 mg BID. She states that she is compliant with this. She has seen hematology earlier this year and she is planning to see vascular surgery soon for her hx.   Problems:  Patient Active Problem List   Diagnosis Date Noted   Genetic testing 03/18/2022   History of pulmonary embolism 02/25/2022   Family history of breast cancer 02/25/2022   History of DVT of lower extremity 02/25/2022   Pulmonary embolism ([place]) 02/11/2022   Primary insomnia 02/11/2022   Localized edema 02/11/2022   Chronic diarrhea    Adenomatous rectal polyp    Nausea without vomiting    Chronic GERD    Erosive gastritis    Prediabetes 10/18/2020   Chronic right-sided thoracic back pain 09/23/2019   Vitamin D deficiency 06/04/2017  Deep venous thrombosis of lower extremity ([place]) 06/19/2016   Tachycardia 06/19/2016   Major depressive disorder, recurrent episode, moderate ([place]) 07/13/2015   PMDD (premenstrual dysphoric disorder) 12/21/2013   GAD (generalized anxiety disorder) 06/24/2013   Morbid obesity with BMI of 60.0-69.9, adult ([place]) 06/24/2013   Endometritis 05/31/2013    Allergies:  Allergies  Allergen Reactions   Tetanus Toxoid Dermatitis, Hives, Itching and Rash    Topiramate     Shaking hands & whoozy   Venlafaxine     Elevation of blood pressure   Medications:  Current Outpatient Medications:    albuterol (VENTOLIN HFA) 108 (90 Base) MCG/ACT inhaler, SMARTSIG:2 Puff(s) By Mouth Every 6 Hours PRN, Disp: , Rfl:    ALPRAZolam (XANAX) 0.5 MG tablet, Take 1 tablet (0.5 mg total) by mouth 2 (two) times daily as needed., Disp: 30 tablet, Rfl: 1   apixaban (ELIQUIS) 2.5 MG TABS tablet, Take 1 tablet (2.5 mg total) by mouth 2 (two) times daily., Disp: 180 tablet, Rfl: 1   cyclobenzaprine (FLEXERIL) 5 MG tablet, Take by mouth as needed., Disp: , Rfl:    esomeprazole (NEXIUM) 40 MG capsule, Take 1 capsule (40 mg total) by mouth daily before breakfast., Disp: 30 capsule, Rfl: 3   furosemide (LASIX) 20 MG tablet, Take 1 tablet (20 mg total) by mouth as needed., Disp: 30 tablet, Rfl: 3   potassium chloride (MICRO-K) 10 MEQ CR capsule, Take 2 capsules (20 mEq total) by mouth as needed., Disp: 30 capsule, Rfl: 3   promethazine (PHENERGAN) 25 MG tablet, Take 1 tablet (25 mg total) by mouth every six (6) hours as needed for nausea., Disp: 30 tablet, Rfl: 2   Semaglutide-Weight Management (WEGOVY) 2.4 MG/0.75ML SOAJ, Inject 2.4 mg into the skin once a week., Disp: 3 mL, Rfl: 0   sertraline (ZOLOFT) 50 MG tablet, Take 1 tablet (50 mg total) by mouth daily., Disp: 30 tablet, Rfl: 1   zolpidem (AMBIEN CR) 6.25 MG CR tablet, TAKE 1 TABLET BY MOUTH NIGHTLY AS NEEDED FOR SLEEP, Disp: 30 tablet, Rfl: 0  Observations/Objective: Patient is well-developed, well-nourished in no acute distress.  Resting comfortably  at home.  Head is normocephalic, atraumatic.  No labored breathing.  Speech is clear and coherent with logical content.  Patient is alert and oriented at baseline.    Assessment and Plan: 1. COVID-19 Due to recent chest pain, current COVID + status, unvaccinated status, and chronic medical history -- patient was advised to have an in-person evaluation performed  for assessment of her symptoms at ER vs UC. She verbalized understanding to plan.  Follow Up Instructions: I discussed the assessment and treatment plan with the patient. The patient was provided an opportunity to ask questions and all were answered. The patient agreed with the plan and demonstrated an understanding of the instructions.  A copy of instructions were sent to the patient via MyChart unless otherwise noted below.   The patient was advised to call back or seek an in-person evaluation if the symptoms worsen or if the condition fails to improve as anticipated.  Time:  I spent 15 minutes with the patient via telehealth technology discussing the above problems/concerns.    Inda Coke, [place]

## 2022-07-05 NOTE — ED Notes (Signed)
Patient called Several times for Triage with no Response. Patient not visualized in any of the Awaiting Areas or Restrooms. Patient to be discharged accordingly.

## 2022-07-10 ENCOUNTER — Encounter: Payer: Self-pay | Admitting: Family Medicine

## 2022-07-11 ENCOUNTER — Other Ambulatory Visit: Payer: Self-pay | Admitting: *Deleted

## 2022-07-11 MED ORDER — AMOXICILLIN-POT CLAVULANATE 875-125 MG PO TABS: 1.0000 | ORAL_TABLET | Freq: Two times a day (BID) | ORAL | 0 refills | Status: DC

## 2022-07-17 ENCOUNTER — Other Ambulatory Visit: Payer: Self-pay

## 2022-07-18 ENCOUNTER — Other Ambulatory Visit: Payer: Self-pay

## 2022-07-27 ENCOUNTER — Other Ambulatory Visit: Payer: Self-pay

## 2022-07-27 ENCOUNTER — Other Ambulatory Visit: Payer: Self-pay | Admitting: Family Medicine

## 2022-07-28 ENCOUNTER — Other Ambulatory Visit: Payer: Self-pay

## 2022-07-28 ENCOUNTER — Encounter: Payer: Self-pay | Admitting: Pharmacist

## 2022-07-28 MED ORDER — WEGOVY 2.4 MG/0.75ML ~~LOC~~ SOAJ
2.4000 mg | SUBCUTANEOUS | 0 refills | Status: DC
Start: 2022-07-28 — End: 2022-09-12
  Filled 2022-07-28 – 2022-08-01 (×2): qty 3, 28d supply, fill #0

## 2022-07-30 ENCOUNTER — Other Ambulatory Visit: Payer: Self-pay

## 2022-08-01 ENCOUNTER — Other Ambulatory Visit: Payer: Self-pay

## 2022-08-01 ENCOUNTER — Ambulatory Visit: Payer: No Typology Code available for payment source | Admitting: Family Medicine

## 2022-08-08 ENCOUNTER — Other Ambulatory Visit: Payer: Self-pay

## 2022-08-14 ENCOUNTER — Other Ambulatory Visit: Payer: Self-pay

## 2022-08-15 ENCOUNTER — Other Ambulatory Visit: Payer: Self-pay

## 2022-08-19 ENCOUNTER — Encounter (INDEPENDENT_AMBULATORY_CARE_PROVIDER_SITE_OTHER): Payer: No Typology Code available for payment source

## 2022-08-19 ENCOUNTER — Encounter (INDEPENDENT_AMBULATORY_CARE_PROVIDER_SITE_OTHER): Payer: No Typology Code available for payment source | Admitting: Nurse Practitioner

## 2022-08-24 IMAGING — DX DG HIP (WITH OR WITHOUT PELVIS) 2-3V*R*
3 series · 3 of 3 positions shown · non-contrast
Comparison: None.

CLINICAL DATA: Pain right hip

EXAM:
DG HIP (WITH OR WITHOUT PELVIS) 2-3V RIGHT

[pelvis ap]
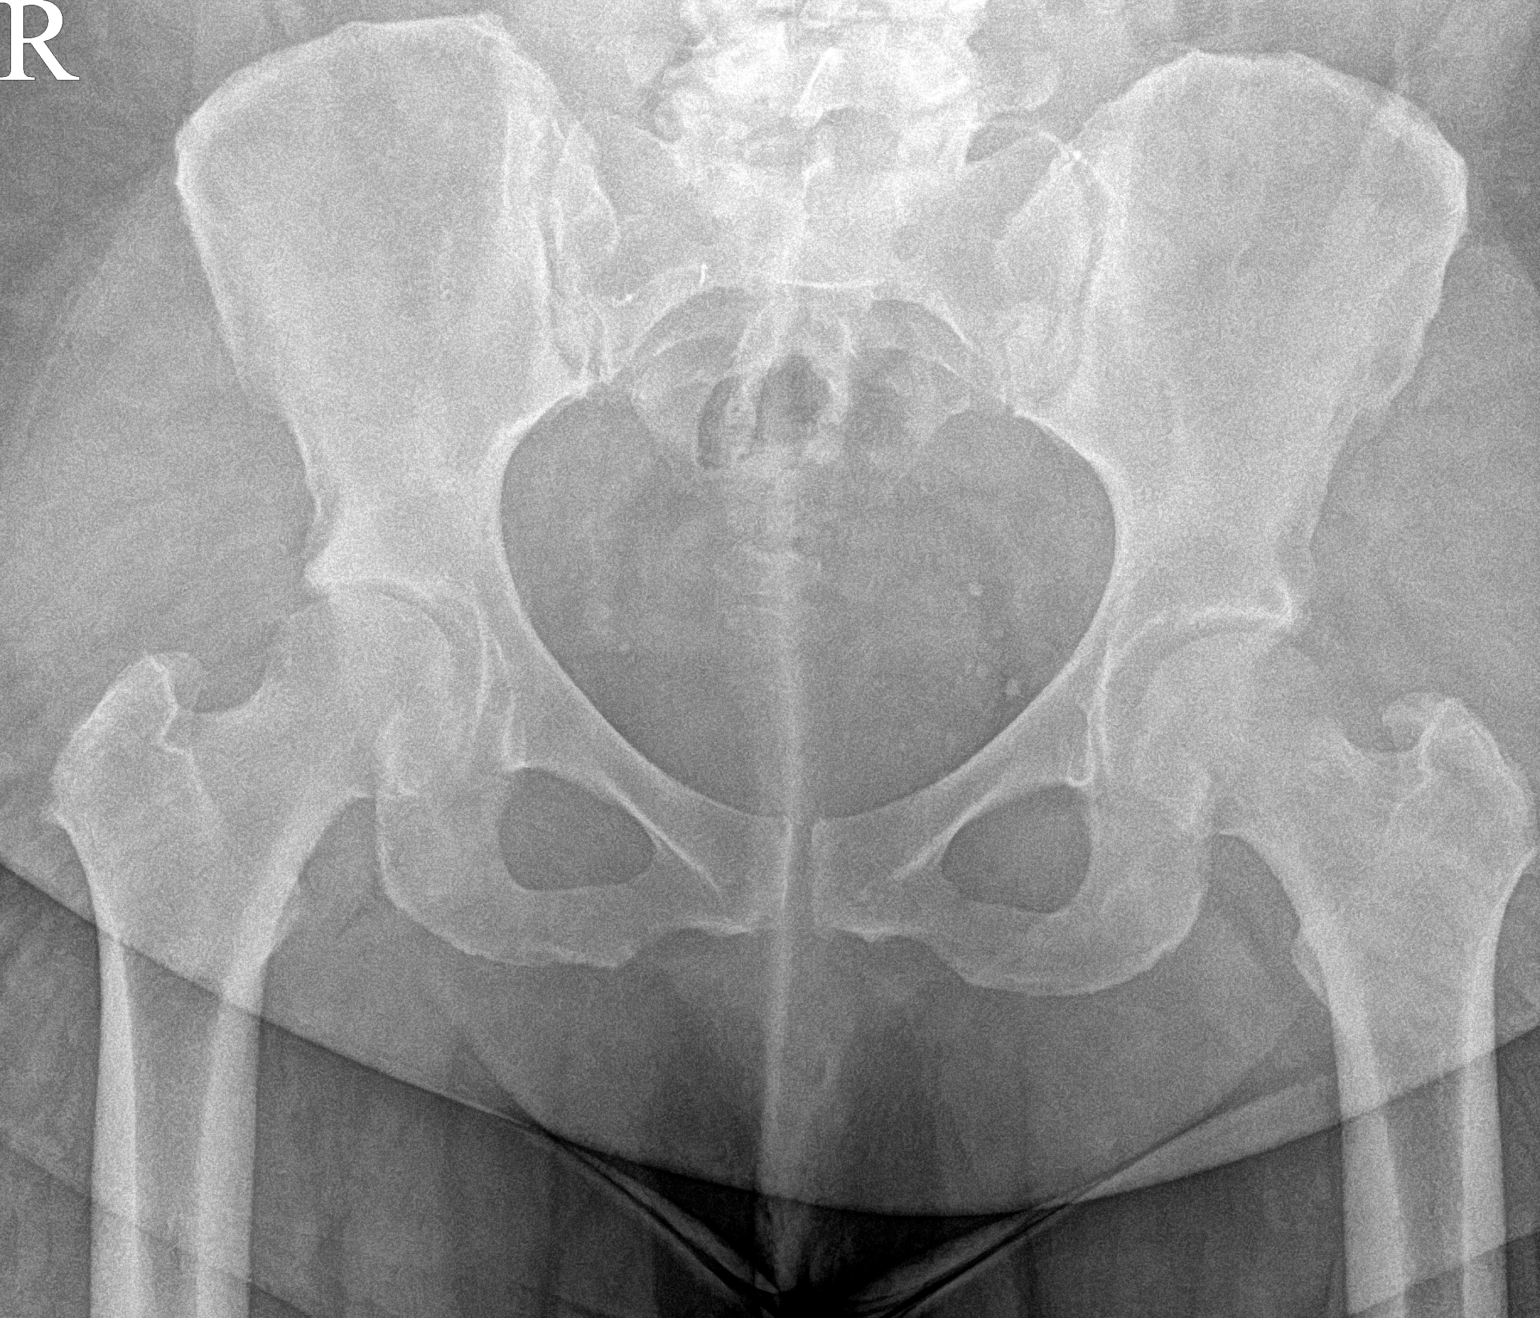

[hip ap]
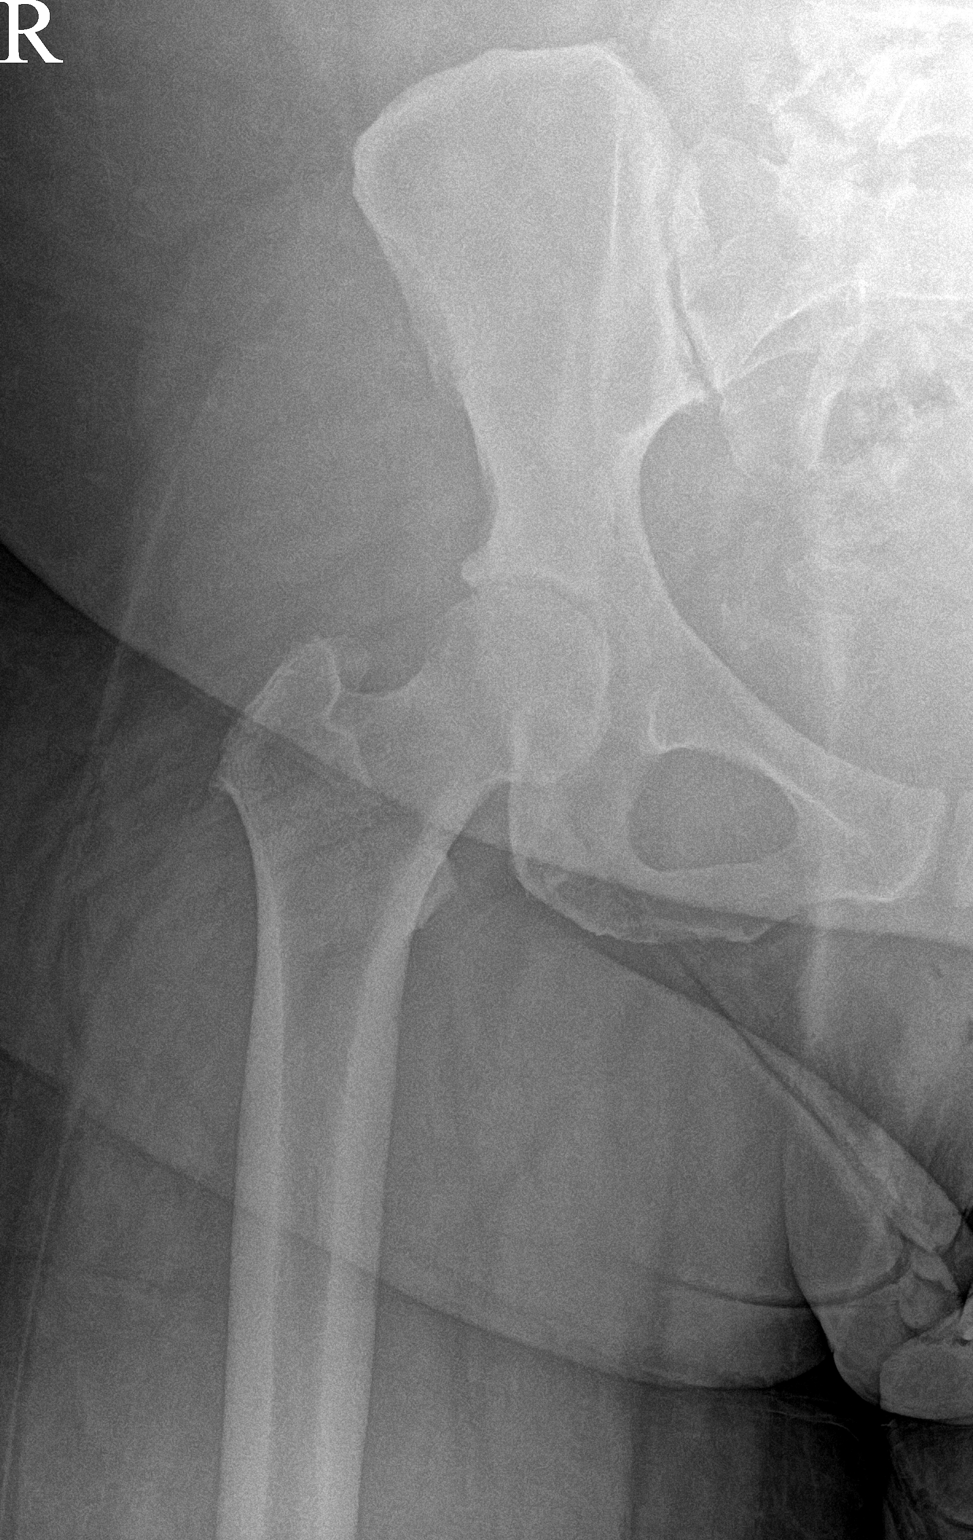

[hip frog leg]
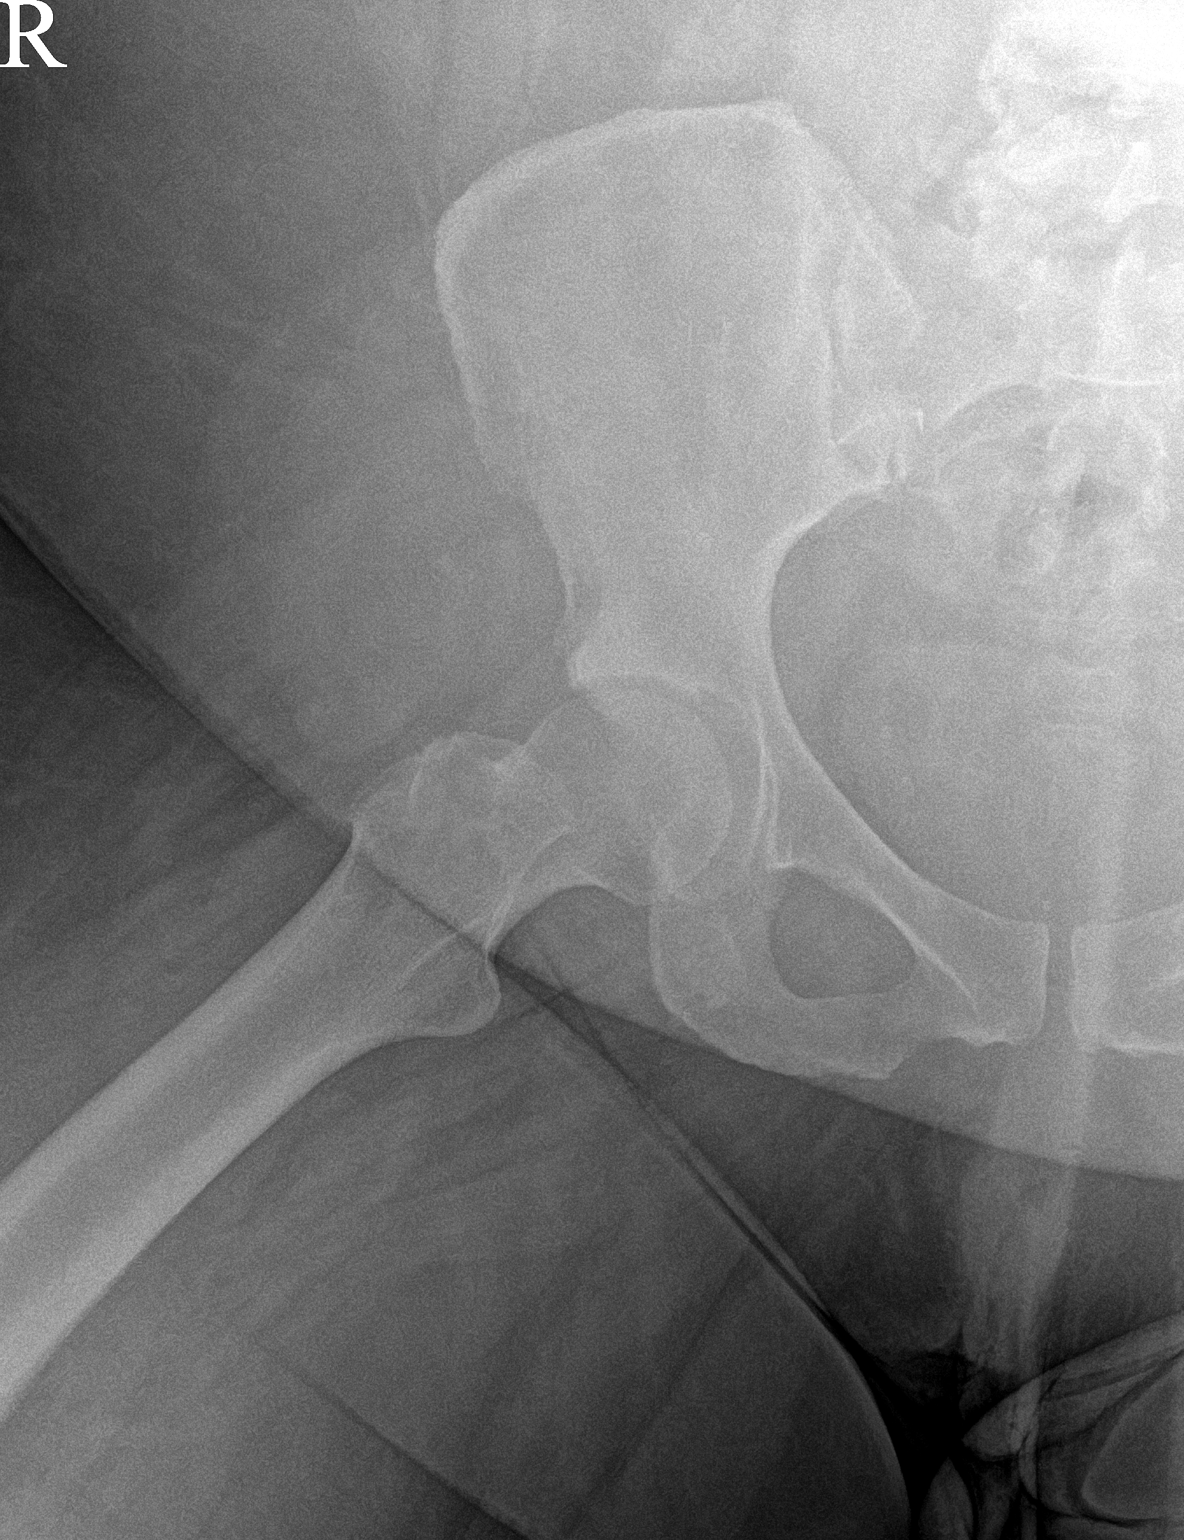

[3 of 3 positions shown; findings below may reference images not displayed]

FINDINGS: No recent fracture or dislocation is seen. There are no focal lytic
lesions. There is narrowing of joint space in the lateral aspect of
right hip. Phleboliths are seen in the pelvis.
IMPRESSION: No recent fracture or dislocation is seen in the right hip. There is
narrowing of joint space in the lateral aspect of right hip
suggesting degenerative arthritis.

## 2022-08-26 ENCOUNTER — Ambulatory Visit: Payer: Medicaid Other | Admitting: Oncology

## 2022-08-26 ENCOUNTER — Other Ambulatory Visit: Payer: Medicaid Other

## 2022-08-29 ENCOUNTER — Ambulatory Visit: Payer: No Typology Code available for payment source | Admitting: Family Medicine

## 2022-09-01 ENCOUNTER — Encounter: Payer: Self-pay | Admitting: *Deleted

## 2022-09-10 ENCOUNTER — Other Ambulatory Visit: Payer: Self-pay

## 2022-09-10 DIAGNOSIS — I2699 Other pulmonary embolism without acute cor pulmonale: Secondary | ICD-10-CM

## 2022-09-12 ENCOUNTER — Other Ambulatory Visit: Payer: Self-pay

## 2022-09-12 ENCOUNTER — Other Ambulatory Visit: Payer: Self-pay | Admitting: Oncology

## 2022-09-12 ENCOUNTER — Inpatient Hospital Stay: Payer: No Typology Code available for payment source

## 2022-09-12 ENCOUNTER — Other Ambulatory Visit: Payer: Self-pay | Admitting: Family Medicine

## 2022-09-12 ENCOUNTER — Inpatient Hospital Stay: Payer: No Typology Code available for payment source | Attending: Oncology | Admitting: Oncology

## 2022-09-12 ENCOUNTER — Encounter: Payer: Self-pay | Admitting: Oncology

## 2022-09-12 ENCOUNTER — Other Ambulatory Visit: Payer: Self-pay | Admitting: Gastroenterology

## 2022-09-12 VITALS — BP 109/77 | HR 94 | Temp 98.2°F | Resp 18 | Wt 310.2 lb

## 2022-09-12 DIAGNOSIS — I2699 Other pulmonary embolism without acute cor pulmonale: Secondary | ICD-10-CM

## 2022-09-12 DIAGNOSIS — Z806 Family history of leukemia: Secondary | ICD-10-CM | POA: Insufficient documentation

## 2022-09-12 DIAGNOSIS — Z86711 Personal history of pulmonary embolism: Secondary | ICD-10-CM | POA: Insufficient documentation

## 2022-09-12 DIAGNOSIS — Z7901 Long term (current) use of anticoagulants: Secondary | ICD-10-CM | POA: Diagnosis not present

## 2022-09-12 DIAGNOSIS — Z803 Family history of malignant neoplasm of breast: Secondary | ICD-10-CM | POA: Diagnosis not present

## 2022-09-12 DIAGNOSIS — Z86718 Personal history of other venous thrombosis and embolism: Secondary | ICD-10-CM | POA: Insufficient documentation

## 2022-09-12 DIAGNOSIS — D508 Other iron deficiency anemias: Secondary | ICD-10-CM | POA: Diagnosis not present

## 2022-09-12 DIAGNOSIS — K219 Gastro-esophageal reflux disease without esophagitis: Secondary | ICD-10-CM

## 2022-09-12 DIAGNOSIS — E669 Obesity, unspecified: Secondary | ICD-10-CM | POA: Insufficient documentation

## 2022-09-12 LAB — COMPREHENSIVE METABOLIC PANEL
ALT: 12 U/L (ref 0–44)
AST: 16 U/L (ref 15–41)
Albumin: 3.7 g/dL (ref 3.5–5.0)
Alkaline Phosphatase: 50 U/L (ref 38–126)
Anion gap: 1 — ABNORMAL LOW (ref 5–15)
BUN: 11 mg/dL (ref 6–20)
CO2: 28 mmol/L (ref 22–32)
Calcium: 9 mg/dL (ref 8.9–10.3)
Chloride: 109 mmol/L (ref 98–111)
Creatinine, Ser: 0.65 mg/dL (ref 0.44–1.00)
GFR, Estimated: >60 mL/min (ref >60)
Glucose, Bld: 108 mg/dL — ABNORMAL HIGH (ref 70–99)
Potassium: 4 mmol/L (ref 3.5–5.1)
Sodium: 138 mmol/L (ref 135–145)
Total Bilirubin: 0.7 mg/dL (ref 0.3–1.2)
Total Protein: 7.7 g/dL (ref 6.5–8.1)

## 2022-09-12 LAB — CBC WITH DIFFERENTIAL/PLATELET
Abs Immature Granulocytes: 0.01 10*3/uL (ref 0.00–0.07)
Basophils Absolute: 0 10*3/uL (ref 0.0–0.1)
Basophils Relative: 1 %
Eosinophils Absolute: 0.1 10*3/uL (ref 0.0–0.5)
Eosinophils Relative: 2 %
HCT: 36.1 % (ref 36.0–46.0)
Hemoglobin: 11.2 g/dL — ABNORMAL LOW (ref 12.0–15.0)
Immature Granulocytes: 0 %
Lymphocytes Relative: 47 %
Lymphs Abs: 2.6 10*3/uL (ref 0.7–4.0)
MCH: 26 pg (ref 26.0–34.0)
MCHC: 31 g/dL (ref 30.0–36.0)
MCV: 83.8 fL (ref 80.0–100.0)
Monocytes Absolute: 0.4 10*3/uL (ref 0.1–1.0)
Monocytes Relative: 8 %
Neutro Abs: 2.3 10*3/uL (ref 1.7–7.7)
Neutrophils Relative %: 42 %
Platelets: 302 10*3/uL (ref 150–400)
RBC: 4.31 MIL/uL (ref 3.87–5.11)
RDW: 15.6 % — ABNORMAL HIGH (ref 11.5–15.5)
WBC: 5.5 10*3/uL (ref 4.0–10.5)
nRBC: 0 % (ref 0.0–0.2)

## 2022-09-12 LAB — IRON AND TIBC
Iron: 36 ug/dL (ref 28–170)
Saturation Ratios: 7 % — ABNORMAL LOW (ref 10.4–31.8)
TIBC: 494 ug/dL — ABNORMAL HIGH (ref 250–450)
UIBC: 458 ug/dL

## 2022-09-12 LAB — FERRITIN: Ferritin: 4 ng/mL — ABNORMAL LOW (ref 11–307)

## 2022-09-12 MED ORDER — WEGOVY 2.4 MG/0.75ML ~~LOC~~ SOAJ
2.4000 mg | SUBCUTANEOUS | 0 refills | Status: DC
  Filled 2022-09-12: qty 3, 28d supply, fill #0

## 2022-09-12 MED ORDER — APIXABAN 2.5 MG PO TABS
2.5000 mg | ORAL_TABLET | Freq: Two times a day (BID) | ORAL | 1 refills | Status: DC
  Filled 2022-09-12: qty 180, 90d supply, fill #0
  Filled 2022-12-19: qty 180, 90d supply, fill #1

## 2022-09-12 MED ORDER — ESOMEPRAZOLE MAGNESIUM 40 MG PO CPDR
40.0000 mg | DELAYED_RELEASE_CAPSULE | Freq: Every day | ORAL | 0 refills | Status: DC
  Filled 2022-09-12: qty 30, 30d supply, fill #0

## 2022-09-12 NOTE — Progress Notes (Signed)
Hematology/Oncology Progress note [phone] [phone]            Patient Care Team: Tawnya Crook, MD as PCP - General (Family Medicine) Earlie Server, MD as Consulting Physician (Hematology)  REFERRING PROVIDER: Tawnya Crook, MD  CHIEF COMPLAINTS/REASON FOR VISIT:  pulmonary embolism and DVT  HISTORY OF PRESENTING ILLNESS:   Courtney Hanson is a  46 y.o.  female with PMH listed below was seen in consultation at the request of  Tawnya Crook, MD  for evaluation of history of pulmonary embolism and DVT.  Patient has remote history of 2 episodes of lower extremity DVT. Per patient, first episode was in 2003, she developed acute left lower extremity calf vein DVT after gallbladder surgery.  She was treated with 3 months of anticoagulation  04/09/2013, 05/31/2013 bilateral lower extremity venous duplex showed no right extremity DVT.  Abnormalities consistent with the sequela of the prior venous obstructive process with findings that appear chronic/longstanding in nature and are not identified in the proximal vein.  05/20/2016, second episode of acute left lower extremity DVT, provoked by birth control pills.  Patient was treated with Eliquis for a period of time.  05/04/2020.She developed shortness of breath and chest pain.  Her work-up showed unprovoked pulmonary embolism on  Patient was started on anticoagulation and has been on Eliquis since then.  Currently she is on Eliquis 5 mg twice daily.  She tolerates well.  Denies any active bleeding events.  Patient has had hypercoagulable work-up done in the past.  Negative factor V Leiden mutation, prothrombin gene mutation. She had  adequate Antithrombin 3 level, protein C and S activity. She was seen by hemat[facility] at [facility] on 10/08/2020.  Antiphospholipid panel was obtained by hematology was negative for lupus anticoagulant, negative anticardiolipin IgM/IgG, negative beta 2 glycoprotein protein IgG/IgM  Patient has  chronic varicose veins on her bilateral lower extremity. Patient denies any shortness of breath.  She has experienced left anterior chest wall pain which she attributes to " pulled muscle".  Patient has family history of breast cancer in sister.  INTERVAL HISTORY Courtney Hanson is a 46 y.o. female who has above history reviewed by me today presents for follow up visit for history of DVT/PE Patient takes eliquis 2.'5mg'$  BID, tolerates well. She denies any bleeding events, chest pain, SOB, leg edema more than her baseline level.    Review of Systems  Constitutional:  Negative for appetite change, chills, fatigue and fever.  HENT:   Negative for hearing loss and voice change.   Eyes:  Negative for eye problems.  Respiratory:  Negative for chest tightness and cough.   Cardiovascular:  Negative for chest pain.  Gastrointestinal:  Negative for abdominal distention, abdominal pain and blood in stool.  Endocrine: Negative for hot flashes.  Genitourinary:  Negative for difficulty urinating and frequency.   Musculoskeletal:  Negative for arthralgias.  Skin:  Negative for itching and rash.  Neurological:  Negative for extremity weakness.  Hematological:  Negative for adenopathy.  Psychiatric/Behavioral:  Negative for confusion.     MEDICAL HISTORY:  Past Medical History:  Diagnosis Date   Anxiety    GERD (gastroesophageal reflux disease)    Lower extremity edema    Pulmonary embolism ([place])     SURGICAL HISTORY: Past Surgical History:  Procedure Laterality Date   CHOLECYSTECTOMY, LAPAROSCOPIC N/A 2003   COLONOSCOPY WITH PROPOFOL N/A 10/17/2021   Procedure: COLONOSCOPY WITH PROPOFOL;  Surgeon: Lin Landsman, MD;  Location:  [facility] ENDOSCOPY;  Service: Gastroenterology;  Laterality: N/A;   DILITATION & CURRETTAGE/HYSTROSCOPY WITH ESSURE     ESOPHAGOGASTRODUODENOSCOPY N/A 10/17/2021   Procedure: ESOPHAGOGASTRODUODENOSCOPY (EGD);  Surgeon: Lin Landsman, MD;  Location[facility]  END[facility]COPY;  Service: Gastroenterology;  Laterality: N/A;   LEEP N/A 2003    SOCIAL HISTORY: Social History   Socioeconomic History   Marital status: Single    Spouse name: Not on file   Number of children: 4   Years of education: Not on file   Highest education level: Not on file  Occupational History   Not on file  Tobacco Use   Smoking status: Never   Smokeless tobacco: Never  Vaping Use   Vaping Use: Never used  Substance and Sexual Activity   Alcohol use: Not Currently   Drug use: Never   Sexual activity: Yes    Birth control/protection: Surgical    Comment: essure  Other Topics Concern   Not on file  Social History Narrative   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 [place] Determinants of Health   Financial Resource Strain: Not on file  Food Insecurity: Not on file  Transportation Needs: Not on file  Physical Activity: Not on file  Stress: Not on file  Social Connections: Not on file  Intimate Partner Violence: Not on file    FAMILY HISTORY: Family History  Problem Relation Age of Onset   Hyperlipidemia Mother    Arthritis Mother    Hypertension Mother    Diabetes Mother    Hyperlipidemia Father    Alcohol abuse Father    Arthritis Father    Hypertension Father    Breast cancer Sister 26   Breast cancer Paternal Aunt    Stomach cancer Paternal Uncle    Cancer Paternal Uncle        unk type   Alcohol abuse Maternal Grandfather    Diabetes Paternal Grandmother    Throat cancer Paternal Grandfather        dx. 7s   Birth defects Paternal Grandfather    Alcohol abuse Paternal Grandfather     ALLERGIES:  is allergic to tetanus toxoid, topiramate, and venlafaxine.  MEDICATIONS:  Current Outpatient Medications  Medication Sig Dispense Refill   albuterol (VENTOLIN HFA) 108 (90 Base) MCG/ACT inhaler SMARTSIG:2 Puff(s) By Mouth Every 6 Hours PRN     ALPRAZolam (XANAX) 0.5 MG tablet Take 1 tablet (0.5 mg total) by mouth 2 (two) times daily as  needed. 30 tablet 1   cyclobenzaprine (FLEXERIL) 5 MG tablet Take by mouth as needed.     esomeprazole (NEXIUM) 40 MG capsule Take 1 capsule (40 mg total) by mouth daily before breakfast. 30 capsule 0   furosemide (LASIX) 20 MG tablet Take 1 tablet (20 mg total) by mouth as needed. 30 tablet 3   potassium chloride (MICRO-K) 10 MEQ CR capsule Take 2 capsules (20 mEq total) by mouth as needed. 30 capsule 3   promethazine (PHENERGAN) 25 MG tablet Take 1 tablet (25 mg total) by mouth every six (6) hours as needed for nausea. 30 tablet 2   Semaglutide-Weight Management (WEGOVY) 2.4 MG/0.75ML SOAJ Inject 2.4 mg into the skin once a week. 3 mL 0   zolpidem (AMBIEN CR) 6.25 MG CR tablet TAKE 1 TABLET BY MOUTH NIGHTLY AS NEEDED FOR SLEEP 30 tablet 0   apixaban (ELIQUIS) 2.5 MG TABS tablet Take 1 tablet (2.5 mg total) by mouth 2 (two) times daily. 180 tablet 1   No current facility-administered medications  for this visit.     PHYSICAL EXAMINATION: ECOG PERFORMANCE STATUS: 0 - Asymptomatic Vitals per epic record  Physical Exam Constitutional:      General: She is not in acute distress.    Appearance: She is obese.  HENT:     Head: Normocephalic and atraumatic.  Eyes:     General: No scleral icterus. Cardiovascular:     Rate and Rhythm: Normal rate and regular rhythm.     Heart sounds: Normal heart sounds.  Pulmonary:     Effort: Pulmonary effort is normal. No respiratory distress.     Breath sounds: No wheezing.  Abdominal:     General: Bowel sounds are normal. There is no distension.     Palpations: Abdomen is soft.  Musculoskeletal:        General: No deformity. Normal range of motion.     Cervical back: Normal range of motion and neck supple.     Comments: Right lower extremity varicose vein  Skin:    General: Skin is warm and dry.     Findings: No erythema or rash.  Neurological:     Mental Status: She is alert and oriented to person, place, and time. Mental status is at baseline.      Cranial Nerves: No cranial nerve deficit.     Coordination: Coordination normal.  Psychiatric:        Mood and Affect: Mood normal.     LABORATORY DATA:  I have reviewed the data as listed Lab Results  Component Value Date   WBC 5.5 09/12/2022   HGB 11.2 (L) 09/12/2022   HCT 36.1 09/12/2022   MCV 83.8 09/12/2022   PLT 302 09/12/2022   Recent Labs    02/11/22 1504 09/12/22 1153  NA 139 138  K 4.3 4.0  CL 102 109  CO2 29 28  GLUCOSE 87 108*  BUN 8 11  CREATININE 0.58 0.65  CALCIUM 9.6 9.0  GFRNONAA  --  >60  PROT 6.9 7.7  ALBUMIN 3.9 3.7  AST 14 16  ALT 12 12  ALKPHOS 54 50  BILITOT 0.4 0.7    Iron/TIBC/Ferritin/ %Sat    Component Value Date/Time   IRON 36 09/12/2022 1153   TIBC 494 (H) 09/12/2022 1153   FERRITIN 4 (L) 09/12/2022 1153   IRONPCTSAT 7 (L) 09/12/2022 1153      RADIOGRAPHIC STUDIES: I have personally reviewed the radiological images as listed and agreed with the findings in the report. No results found.    ASSESSMENT & PLAN:  1. History of pulmonary embolism   2. History of DVT (deep vein thrombosis)   3. Family history of leukemia   4. Other iron deficiency anemia    #History of unprovoked pulmonary embolism, history of recurrent provoked DVT of lower extremities. Patient has other risk factors for thrombosis  Recommend long term anticoagulation prophylaxis with Eliquis 2.'5mg'$  BID  # IDA  Recommend Vitron C daily Repeat iron panel at next visit # Obesity, recommend lifestyle modification including diet and exercise.  # Family history of breast cancer, patient is up-to-date for mammogram annually. 03/18/22 genetic testing negative.   Orders Placed This Encounter  Procedures   Ferritin    Standing Status:   Future    Number of Occurrences:   1    Standing Expiration Date:   09/13/2023   Iron and TIBC    Standing Status:   Future    Number of Occurrences:   1    Standing Expiration Date:  09/13/2023   CBC with  Differential/Platelet    Standing Status:   Future    Standing Expiration Date:   09/13/2023   Comprehensive metabolic panel    Standing Status:   Future    Standing Expiration Date:   09/12/2023    All questions were answered. The patient knows to call the clinic with any problems questions or concerns.  cc Tawnya Crook, MD    Return of visit: 6 months.   Earlie Server, MD, PhD [facility] Hematology Oncology 09/12/2022

## 2022-09-13 DIAGNOSIS — Z86718 Personal history of other venous thrombosis and embolism: Secondary | ICD-10-CM | POA: Insufficient documentation

## 2022-09-13 DIAGNOSIS — D509 Iron deficiency anemia, unspecified: Secondary | ICD-10-CM | POA: Insufficient documentation

## 2022-09-13 MED ORDER — VITRON-C 65-125 MG PO TABS
1.0000 | ORAL_TABLET | Freq: Every day | ORAL | 1 refills | Status: DC
  Filled 2022-09-13: qty 90, fill #0
  Filled 2022-09-17: qty 90, 90d supply, fill #0
  Filled 2023-04-22: qty 90, 90d supply, fill #1

## 2022-09-13 NOTE — Assessment & Plan Note (Addendum)
Recommend Vitron C daily Repeat iron panel at next visit

## 2022-09-14 ENCOUNTER — Other Ambulatory Visit: Payer: Self-pay

## 2022-09-15 ENCOUNTER — Other Ambulatory Visit: Payer: Self-pay

## 2022-09-15 NOTE — Addendum Note (Signed)
Addended by: Earlie Server on: 09/15/2022 08:50 AM   Modules accepted: Orders

## 2022-09-17 ENCOUNTER — Other Ambulatory Visit: Payer: Self-pay

## 2022-09-17 ENCOUNTER — Ambulatory Visit
Admission: EM | Admit: 2022-09-17 | Discharge: 2022-09-17 | Disposition: A | Discharge status: Home / Self Care | Payer: No Typology Code available for payment source | Attending: Emergency Medicine | Admitting: Emergency Medicine

## 2022-09-17 DIAGNOSIS — Z113 Encounter for screening for infections with a predominantly sexual mode of transmission: Secondary | ICD-10-CM | POA: Insufficient documentation

## 2022-09-17 DIAGNOSIS — N898 Other specified noninflammatory disorders of vagina: Secondary | ICD-10-CM | POA: Insufficient documentation

## 2022-09-17 LAB — POCT URINALYSIS DIP (MANUAL ENTRY)
Blood, UA: NEGATIVE
Glucose, UA: NEGATIVE mg/dL
Ketones, POC UA: NEGATIVE mg/dL
Leukocytes, UA: NEGATIVE
Nitrite, UA: NEGATIVE
Protein Ur, POC: 30 mg/dL — AB
Spec Grav, UA: >=1.03 — AB (ref 1.010–1.025)
Urobilinogen, UA: 0.2 E.U./dL
pH, UA: 6 (ref 5.0–8.0)

## 2022-09-17 LAB — POCT URINE PREGNANCY: Preg Test, Ur: NEGATIVE

## 2022-09-17 MED ORDER — NYSTATIN 100000 UNIT/GM EX CREA: TOPICAL_CREAM | CUTANEOUS | 0 refills | Status: DC

## 2022-09-17 NOTE — ED Triage Notes (Signed)
Patient to urgent Care with complaints of vaginal itching x2 days. Denies any known discharge or urinary symptoms. Reports potential STD.

## 2022-09-17 NOTE — Discharge Instructions (Addendum)
Use the Nystatin cream as directed.    Your  tests are pending.  If your test results are positive, we will call you.  Do not have sexual activity for at least 7 days.    Follow up with your primary care provider if your symptoms are not improving.

## 2022-09-17 NOTE — ED Provider Notes (Signed)
[facility]    CSN: [id number] Arrival date & time: 09/17/22  1350      History   Chief Complaint Chief Complaint  Patient presents with   Exposure to STD    HPI Courtney Hanson is a 46 y.o. female.  Patient presents with 2-day history of vaginal itching.  No treatments at home.  She requests STD testing.  She denies vaginal discharge, pelvic pain, abdominal pain, dysuria, fever, rash, or other symptoms.    The history is provided by the patient and medical records.    Past Medical History:  Diagnosis Date   Anxiety    GERD (gastroesophageal reflux disease)    Lower extremity edema    Pulmonary embolism [facility])     Patient Active Problem List   Diagnosis Date Noted   History of DVT (deep vein thrombosis) 09/13/2022   IDA (iron deficiency anemia) 09/13/2022   Genetic testing 03/18/2022   History of pulmonary embolism 02/25/2022   Family history of breast cancer 02/25/2022   History of DVT of lower extremity 02/25/2022   Pulmonary embolism ([place]) 02/11/2022   Primary insomnia 02/11/2022   Localized edema 02/11/2022   Chronic diarrhea    Adenomatous rectal polyp    Nausea without vomiting    Chronic GERD    Erosive gastritis    Prediabetes 10/18/2020   Chronic right-sided thoracic back pain 09/23/2019   Vitamin D deficiency 06/04/2017   Deep venous thrombosis of lower extremity ([place]) 06/19/2016   Tachycardia 06/19/2016   Major depressive disorder, recurrent episode, moderate ([place]) 07/13/2015   PMDD (premenstrual dysphoric disorder) 12/21/2013   GAD (generalized anxiety disorder) 06/24/2013   Morbid obesity with BMI of 60.0-69.9, adult ([place]) 06/24/2013   Endometritis 05/31/2013    Past Surgical History:  Procedure Laterality Date   CHOLECYSTECTOMY, LAPAROSCOPIC N/A 2003   COLONOSCOPY WITH PROPOFOL N/A 10/17/2021   Procedure: COLONOSCOPY WITH PROPOFOL;  Surgeon: Lin Landsman, MD;  Location: [facility] ENDOSCOPY;  Service: Gastroenterology;  Laterality:  N/A;   DILITATION & CURRETTAGE/HYSTROSCOPY WITH ESSURE     ESOPHAGOGASTRODUODENOSCOPY N/A 10/17/2021   Procedure: ESOPHAGOGASTRODUODENOSCOPY (EGD);  Surgeon: Lin Landsman, MD;  Location: [facility] ENDOSCOPY;  Service: Gastroenterology;  Laterality: N/A;   LEEP N/A 2003    OB History   No obstetric history on file.      Home Medications    Prior to Admission medications   Medication Sig Start Date End Date Taking? Authorizing Provider  nystatin cream (MYCOSTATIN) Apply to affected area 2 times daily 09/17/22  Yes Sharion Balloon, NP  albuterol (VENTOLIN HFA) 108 (90 Base) MCG/ACT inhaler SMARTSIG:2 Puff(s) By Mouth Every 6 Hours PRN 11/18/21   [provider]  ALPRAZolam Duanne Moron) 0.5 MG tablet Take 1 tablet (0.5 mg total) by mouth 2 (two) times daily as needed. 02/11/22   Tawnya Crook, MD  apixaban (ELIQUIS) 2.5 MG TABS tablet Take 1 tablet (2.5 mg total) by mouth 2 (two) times daily. 09/12/22   Earlie Server, MD  cyclobenzaprine (FLEXERIL) 5 MG tablet Take by mouth as needed.    [provider]  esomeprazole (NEXIUM) 40 MG capsule Take 1 capsule (40 mg total) by mouth daily before breakfast. 09/12/22 10/12/22  Lin Landsman, MD  furosemide (LASIX) 20 MG tablet Take 1 tablet (20 mg total) by mouth as needed. 02/11/22   Tawnya Crook, MD  Iron-Vitamin C (VITRON-C) 65-125 MG TABS Take 1 tablet by mouth daily. 09/13/22   Earlie Server, MD  potassium chloride (  MICRO-K) 10 MEQ CR capsule Take 2 capsules (20 mEq total) by mouth as needed. 02/11/22   Tawnya Crook, MD  promethazine (PHENERGAN) 25 MG tablet Take 1 tablet (25 mg total) by mouth every six (6) hours as needed for nausea. 10/29/21     Semaglutide-Weight Management (WEGOVY) 2.4 MG/0.75ML SOAJ Inject 2.4 mg into the skin once a week. 09/12/22   Tawnya Crook, MD  zolpidem (AMBIEN CR) 6.25 MG CR tablet TAKE 1 TABLET BY MOUTH NIGHTLY AS NEEDED FOR SLEEP 02/11/22 09/12/22  Tawnya Crook, MD    Family History Family  History  Problem Relation Age of Onset   Hyperlipidemia Mother    Arthritis Mother    Hypertension Mother    Diabetes Mother    Hyperlipidemia Father    Alcohol abuse Father    Arthritis Father    Hypertension Father    Breast cancer Sister 36   Breast cancer Paternal Aunt    Stomach cancer Paternal Uncle    Cancer Paternal Uncle        unk type   Alcohol abuse Maternal Grandfather    Diabetes Paternal Grandmother    Throat cancer Paternal Grandfather        dx. 42s   Birth defects Paternal Grandfather    Alcohol abuse Paternal Grandfather     Social History Social History   Tobacco Use   Smoking status: Never   Smokeless tobacco: Never  Vaping Use   Vaping Use: Never used  Substance Use Topics   Alcohol use: Not Currently   Drug use: Never     Allergies   Tetanus toxoid, Topiramate, and Venlafaxine   Review of Systems Review of Systems  Constitutional:  Negative for chills and fever.  Gastrointestinal:  Negative for abdominal pain, diarrhea and vomiting.  Genitourinary:  Negative for dysuria, flank pain, hematuria, pelvic pain and vaginal discharge.       Vaginal itching  Skin:  Negative for color change and rash.  All other systems reviewed and are negative.    Physical Exam Triage Vital Signs ED Triage Vitals  Enc Vitals Group     BP      Pulse      Resp      Temp      Temp src      SpO2      Weight      Height      Head Circumference      Peak Flow      Pain Score      Pain Loc      Pain Edu?      Excl. in [place]?    No data found.  Updated Vital Signs BP 118/79   Pulse (!) 111   Temp 98.2 F (36.8 C)   Resp 18   Ht '5\' 1"'$  (1.549 m)   Wt (!) 305 lb (138.3 kg)   LMP 09/03/2022   SpO2 98%   BMI 57.63 kg/m   Visual Acuity Right Eye Distance:   Left Eye Distance:   Bilateral Distance:    Right Eye Near:   Left Eye Near:    Bilateral Near:     Physical Exam Vitals and nursing note reviewed.  Constitutional:      General: She  is not in acute distress.    Appearance: She is well-developed. She is not ill-appearing.  HENT:     Mouth/Throat:     Mouth: Mucous membranes are moist.  Cardiovascular:  Rate and Rhythm: Normal rate and regular rhythm.     Heart sounds: Normal heart sounds.  Pulmonary:     Effort: Pulmonary effort is normal. No respiratory distress.     Breath sounds: Normal breath sounds.  Abdominal:     General: Bowel sounds are normal.     Palpations: Abdomen is soft.     Tenderness: There is no abdominal tenderness. There is no right CVA tenderness, left CVA tenderness, guarding or rebound.  Musculoskeletal:     Cervical back: Neck supple.  Skin:    General: Skin is warm and dry.  Neurological:     Mental Status: She is alert.  Psychiatric:        Mood and Affect: Mood normal.        Behavior: Behavior normal.      UC Treatments / Results  Labs (all labs ordered are listed, but only abnormal results are displayed) Labs Reviewed  POCT URINALYSIS DIP (MANUAL ENTRY) - Abnormal; Notable for the following components:      Result Value   Bilirubin, UA small (*)    Spec Grav, UA >=1.030 (*)    Protein Ur, POC =30 (*)    All other components within normal limits  HIV ANTIBODY (ROUTINE TESTING W REFLEX)  RPR  POCT URINE PREGNANCY  CERVICOVAGINAL ANCILLARY ONLY    EKG   Radiology No results found.  Procedures Procedures (including critical care time)  Medications Ordered in UC Medications - No data to display  Initial Impression / Assessment and Plan / UC Course  I have reviewed the triage vital signs and the nursing notes.  Pertinent labs & imaging results that were available during my care of the patient were reviewed by me and considered in my medical decision making (see chart for details).   Vaginal itching, STD screening.  Patient obtained vaginal self swab for testing.  Discussed that we will call if test results are positive.  Treating with Nystatin cream.   Instructed patient to abstain from sexual activity for at least 7 days.  Instructed her to follow-up with her PCP or gynecologist if her symptoms are not improving.  Patient agrees to plan of care.    Final Clinical Impressions(s) / UC Diagnoses   Final diagnoses:  Screening for STD (sexually transmitted disease)  Vaginal itching     Discharge Instructions      Use the Nystatin cream as directed.    Your  tests are pending.  If your test results are positive, we will call you.  Do not have sexual activity for at least 7 days.    Follow up with your primary care provider if your symptoms are not improving.        ED Prescriptions     Medication Sig Dispense Auth. Provider   nystatin cream (MYCOSTATIN) Apply to affected area 2 times daily 30 g Sharion Balloon, NP      PDMP not reviewed this encounter.   Sharion Balloon, NP 09/17/22 1427

## 2022-09-18 ENCOUNTER — Telehealth (HOSPITAL_COMMUNITY): Payer: Self-pay | Admitting: Emergency Medicine

## 2022-09-18 ENCOUNTER — Other Ambulatory Visit: Payer: Self-pay

## 2022-09-18 LAB — CERVICOVAGINAL ANCILLARY ONLY
Bacterial Vaginitis (gardnerella): POSITIVE — AB
Candida Glabrata: NEGATIVE
Candida Vaginitis: POSITIVE — AB
Chlamydia: NEGATIVE
Comment: NEGATIVE
Comment: NEGATIVE
Comment: NEGATIVE
Comment: NEGATIVE
Comment: NEGATIVE
Comment: NORMAL
Neisseria Gonorrhea: NEGATIVE
Trichomonas: NEGATIVE

## 2022-09-18 LAB — RPR: RPR Ser Ql: NONREACTIVE

## 2022-09-18 LAB — HIV ANTIBODY (ROUTINE TESTING W REFLEX): HIV Screen 4th Generation wRfx: NONREACTIVE

## 2022-09-18 MED ORDER — FLUCONAZOLE 150 MG PO TABS
150.0000 mg | ORAL_TABLET | Freq: Once | ORAL | 0 refills | Status: AC
  Filled 2022-09-18: qty 2, 3d supply, fill #0

## 2022-09-18 MED ORDER — METRONIDAZOLE 0.75 % VA GEL
1.0000 | Freq: Every day | VAGINAL | 0 refills | Status: AC
  Filled 2022-09-18: qty 70, 7d supply, fill #0

## 2022-09-19 ENCOUNTER — Other Ambulatory Visit: Payer: Self-pay

## 2022-09-26 ENCOUNTER — Ambulatory Visit: Payer: No Typology Code available for payment source | Admitting: Family Medicine

## 2022-10-06 ENCOUNTER — Encounter (INDEPENDENT_AMBULATORY_CARE_PROVIDER_SITE_OTHER): Payer: Self-pay

## 2022-11-05 ENCOUNTER — Other Ambulatory Visit: Payer: Self-pay

## 2022-11-07 ENCOUNTER — Other Ambulatory Visit: Payer: Self-pay

## 2022-11-07 ENCOUNTER — Ambulatory Visit (INDEPENDENT_AMBULATORY_CARE_PROVIDER_SITE_OTHER): Payer: No Typology Code available for payment source | Admitting: Family Medicine

## 2022-11-07 ENCOUNTER — Encounter: Payer: Self-pay | Admitting: Family Medicine

## 2022-11-07 VITALS — BP 135/87 | HR 90 | Temp 98.1°F | Ht 61.0 in

## 2022-11-07 DIAGNOSIS — F331 Major depressive disorder, recurrent, moderate: Secondary | ICD-10-CM | POA: Diagnosis not present

## 2022-11-07 DIAGNOSIS — K219 Gastro-esophageal reflux disease without esophagitis: Secondary | ICD-10-CM

## 2022-11-07 DIAGNOSIS — Z6841 Body Mass Index (BMI) 40.0 and over, adult: Secondary | ICD-10-CM

## 2022-11-07 DIAGNOSIS — F411 Generalized anxiety disorder: Secondary | ICD-10-CM | POA: Diagnosis not present

## 2022-11-07 MED ORDER — FLUOXETINE HCL 40 MG PO CAPS
40.0000 mg | ORAL_CAPSULE | Freq: Every day | ORAL | 3 refills | Status: DC
  Filled 2022-11-07: qty 90, 90d supply, fill #0
  Filled 2023-02-04: qty 90, 90d supply, fill #1
  Filled 2023-04-22 – 2023-04-24 (×2): qty 90, 90d supply, fill #2
  Filled 2023-08-05: qty 90, 90d supply, fill #3

## 2022-11-07 MED ORDER — WEGOVY 1.7 MG/0.75ML ~~LOC~~ SOAJ
1.7000 mg | SUBCUTANEOUS | 3 refills | Status: DC
  Filled 2022-11-07: qty 3, 28d supply, fill #0

## 2022-11-07 MED ORDER — PROMETHAZINE HCL 25 MG PO TABS
ORAL_TABLET | ORAL | 2 refills | Status: DC
  Filled 2022-11-07: qty 30, 7d supply, fill #0

## 2022-11-07 MED ORDER — ESOMEPRAZOLE MAGNESIUM 40 MG PO CPDR
40.0000 mg | DELAYED_RELEASE_CAPSULE | Freq: Every day | ORAL | 1 refills | Status: DC
  Filled 2022-11-07: qty 90, 90d supply, fill #0
  Filled 2022-12-19: qty 90, 90d supply, fill #1

## 2022-11-07 MED ORDER — ALPRAZOLAM 0.5 MG PO TABS
0.5000 mg | ORAL_TABLET | Freq: Two times a day (BID) | ORAL | 1 refills | Status: DC | PRN
  Filled 2022-11-07: qty 30, 15d supply, fill #0

## 2022-11-07 MED ORDER — ZOLPIDEM TARTRATE ER 6.25 MG PO TBCR
EXTENDED_RELEASE_TABLET | Freq: Every evening | ORAL | 1 refills | Status: DC | PRN
  Filled 2022-11-07: qty 30, 30d supply, fill #0

## 2022-11-07 NOTE — Progress Notes (Signed)
Subjective:     Patient ID: Courtney Hanson, female    DOB: 06-17-1976, 46 y.o.   MRN: [id number]  No chief complaint on file.   HPI Obesity-has lost 32# on wegovy-stopped 1 mo ago d/t  abd cramps.  Did best on '1mg'$ .  Working on diet.  Exercising some.  2.  Moods-sertraline not working so back to 40 prozac or working.  No SI. Xanax about once/month. Not depressed by more moody at times.  Overwhelmed.  Ambien 1-2x/wk 3.  GERD-out of nexium but not doing well.  Needs refill.  No dysphagia.Marland Kitchen no dark stools 4.  Getting a lot of tension ha lately.    Health Maintenance Due  Topic Date Due   Hepatitis C Screening  Never done   PAP SMEAR-Modifier  05/14/2022    Past Medical History:  Diagnosis Date   Anxiety    GERD (gastroesophageal reflux disease)    Lower extremity edema    Pulmonary embolism ([place])     Past Surgical History:  Procedure Laterality Date   CHOLECYSTECTOMY, LAPAROSCOPIC N/A 2003   COLONOSCOPY WITH PROPOFOL N/A 10/17/2021   Procedure: COLONOSCOPY WITH PROPOFOL;  Surgeon: Lin Landsman, MD;  Location: [facility] ENDOSCOPY;  Service: Gastroenterology;  Laterality: N/A;   DILITATION & CURRETTAGE/HYSTROSCOPY WITH ESSURE     ESOPHAGOGASTRODUODENOSCOPY N/A 10/17/2021   Procedure: ESOPHAGOGASTRODUODENOSCOPY (EGD);  Surgeon: Lin Landsman, MD;  Location: [facility] ENDOSCOPY;  Service: Gastroenterology;  Laterality: N/A;   LEEP N/A 2003    Outpatient Medications Prior to Visit  Medication Sig Dispense Refill   albuterol (VENTOLIN HFA) 108 (90 Base) MCG/ACT inhaler SMARTSIG:2 Puff(s) By Mouth Every 6 Hours PRN     ALPRAZolam (XANAX) 0.5 MG tablet Take 1 tablet (0.5 mg total) by mouth 2 (two) times daily as needed. 30 tablet 1   apixaban (ELIQUIS) 2.5 MG TABS tablet Take 1 tablet (2.5 mg total) by mouth 2 (two) times daily. 180 tablet 1   cyclobenzaprine (FLEXERIL) 5 MG tablet Take by mouth as needed.     furosemide (LASIX) 20 MG tablet Take 1 tablet (20 mg total) by mouth  as needed. 30 tablet 3   Iron-Vitamin C (VITRON-C) 65-125 MG TABS Take 1 tablet by mouth daily. 90 tablet 1   nystatin cream (MYCOSTATIN) Apply to affected area 2 times daily 30 g 0   potassium chloride (MICRO-K) 10 MEQ CR capsule Take 2 capsules (20 mEq total) by mouth as needed. 30 capsule 3   promethazine (PHENERGAN) 25 MG tablet Take 1 tablet (25 mg total) by mouth every six (6) hours as needed for nausea. 30 tablet 2   Semaglutide-Weight Management (WEGOVY) 2.4 MG/0.75ML SOAJ Inject 2.4 mg into the skin once a week. 3 mL 0   esomeprazole (NEXIUM) 40 MG capsule Take 1 capsule (40 mg total) by mouth daily before breakfast. 30 capsule 0   zolpidem (AMBIEN CR) 6.25 MG CR tablet TAKE 1 TABLET BY MOUTH NIGHTLY AS NEEDED FOR SLEEP 30 tablet 0   No facility-administered medications prior to visit.    Allergies  Allergen Reactions   Tetanus Toxoid Dermatitis, Hives, Itching and Rash   Topiramate     Shaking hands & whoozy   Venlafaxine     Elevation of blood pressure   ROS neg/noncontributory except as noted HPI/below      Objective:     BP 135/87   Pulse 90   Temp 98.1 F (36.7 C) (Temporal)   Ht '5\' 1"'$  (1.549 m)  SpO2 100%   BMI 57.63 kg/m  Wt Readings from Last 3 Encounters:  09/17/22 (!) 305 lb (138.3 kg)  09/12/22 (!) 310 lb 3.2 oz (140.7 kg)  03/17/22 (!) 339 lb 12.8 oz (154.1 kg)    Physical Exam   Gen: WDWN NAD HEENT: NCAT, conjunctiva not injected, sclera nonicteric NECK:  supple, no thyromegaly, no nodes, no carotid bruits CARDIAC: RRR, S1S2+, no murmur. DP 2+B LUNGS: CTAB. No wheezes ABDOMEN:  BS+, soft, NTND, No HSM, no masses EXT:  no edema MSK: no gross abnormalities.  NEURO: A&O x3.  CN II-XII intact.  PSYCH: normal mood. Good eye contact     Assessment & Plan:   Problem List Items Addressed This Visit       Digestive   Chronic GERD     Other   GAD (generalized anxiety disorder)   Major depressive disorder, recurrent episode, moderate ([place])  - Primary   Morbid obesity with BMI of 60.0-69.9, adult ([place])   GERD-chronic.  Not controlled off meds.  Get back on nexium '40mg'$  Anxiety/depression-chronic.  Better on prozac '40mg'$ -renewed.  Xanax 0.'5mg'$  prn, ambien '10mg'$ .  Refer for counseling. Pdmp reviewed Morbid obesity-pt lost >10%(30#). On wegovy and lifestyle changes.  Out for 1 mo dt SE.  Will go to 1.'7mg'$  weekly.  If too much, then '1mg'$ .   F/u 5mo No orders of the defined types were placed in this encounter.   AWellington Hampshire MD

## 2022-11-07 NOTE — Patient Instructions (Addendum)
It was very nice to see you today!  Happy Holidays  Stretches.  Magnesium 250-'400mg'$  daily, tumeric caps daily.  Essential oils-peppermint  See gyn-Dr. Sabra Heck   PLEASE NOTE:  If you had any lab tests please let us know if you have not heard back within a few days. You may see your results on MyChart before we have a chance to review them but we will give you a call once they are reviewed by [place]. If we ordered any referrals today, please let us know if you have not heard from their office within the next week.   Please try these tips to maintain a healthy lifestyle:  Eat most of your calories during the day when you are active. Eliminate processed foods including packaged sweets (pies, cakes, cookies), reduce intake of potatoes, white bread, white pasta, and white rice. Look for whole grain options, oat flour or almond flour.  Each meal should contain half fruits/vegetables, one quarter protein, and one quarter carbs (no bigger than a computer mouse).  Cut down on sweet beverages. This includes juice, soda, and sweet tea. Also watch fruit intake, though this is a healthier sweet option, it still contains natural sugar! Limit to 3 servings daily.  Drink at least 1 glass of water with each meal and aim for at least 8 glasses per day  Exercise at least 150 minutes every week.

## 2022-11-17 ENCOUNTER — Other Ambulatory Visit: Payer: Self-pay | Admitting: Family Medicine

## 2022-11-17 DIAGNOSIS — Z1231 Encounter for screening mammogram for malignant neoplasm of breast: Secondary | ICD-10-CM

## 2022-11-18 ENCOUNTER — Encounter: Payer: Self-pay | Admitting: Family Medicine

## 2022-11-18 ENCOUNTER — Other Ambulatory Visit: Payer: Self-pay | Admitting: Family Medicine

## 2022-11-18 MED ORDER — WEGOVY 1 MG/0.5ML ~~LOC~~ SOAJ
1.0000 mg | SUBCUTANEOUS | 2 refills | Status: DC
  Filled 2022-11-18: qty 2, 28d supply, fill #0
  Filled 2022-12-19: qty 2, 28d supply, fill #1

## 2022-11-19 ENCOUNTER — Other Ambulatory Visit: Payer: Self-pay

## 2022-11-27 ENCOUNTER — Ambulatory Visit: Payer: Medicaid Other | Admitting: Cardiology

## 2022-12-03 ENCOUNTER — Ambulatory Visit
Admission: RE | Admit: 2022-12-03 | Discharge: 2022-12-03 | Disposition: A | Discharge status: Home / Self Care | Payer: No Typology Code available for payment source | Source: Ambulatory Visit

## 2022-12-03 DIAGNOSIS — Z1231 Encounter for screening mammogram for malignant neoplasm of breast: Secondary | ICD-10-CM

## 2022-12-15 LAB — HM MAMMOGRAPHY

## 2022-12-16 ENCOUNTER — Encounter: Payer: Self-pay | Admitting: Family Medicine

## 2022-12-19 ENCOUNTER — Ambulatory Visit: Payer: 59 | Admitting: Clinical

## 2022-12-19 ENCOUNTER — Other Ambulatory Visit: Payer: Self-pay

## 2022-12-19 DIAGNOSIS — F4321 Adjustment disorder with depressed mood: Secondary | ICD-10-CM

## 2022-12-19 DIAGNOSIS — F411 Generalized anxiety disorder: Secondary | ICD-10-CM
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
## 2022-12-19 NOTE — Progress Notes (Unsigned)
                Katherina Right, LCSW

## 2022-12-22 ENCOUNTER — Other Ambulatory Visit: Payer: Self-pay

## 2023-01-09 ENCOUNTER — Ambulatory Visit (INDEPENDENT_AMBULATORY_CARE_PROVIDER_SITE_OTHER): Payer: 59 | Admitting: Clinical

## 2023-01-09 DIAGNOSIS — F411 Generalized anxiety disorder: Secondary | ICD-10-CM | POA: Diagnosis not present

## 2023-01-09 DIAGNOSIS — F4321 Adjustment disorder with depressed mood: Secondary | ICD-10-CM

## 2023-01-09 NOTE — Progress Notes (Signed)
[place] Counselor/Therapist Progress Note  Patient ID: Courtney Hanson, MRN: [id number]    Date: 01/09/23  Time Spent: 9:30  am - 10:11 am : 41 Minutes  Treatment Type: Individual Therapy.  Reported Symptoms: Patient reported improvement in symptoms since last session  Mental Status Exam: Appearance:  Neat     Behavior: Appropriate  Motor: Normal  Speech/Language:  Clear and Coherent  Affect: Appropriate  Mood: normal  Thought process: normal  Thought content:   WNL  Sensory/Perceptual disturbances:   WNL  Orientation: oriented to person, place, and situation  Attention: Good  Concentration: Good  Memory: WNL  Fund of knowledge:  Good  Insight:   Good  Judgment:  Good  Impulse Control: Good   Risk Assessment: Danger to Self:  No Patient denied current suicidal ideation Self-injurious Behavior: No Danger to Others: No Patient denied current homicidal ideation Duty to Warn:no Physical Aggression / Violence:No  Access to Firearms a concern: No  Gang Involvement:No   Subjective:  Patient stated, "they've been better" since last session. Patient reported not feeling as overwhelmed as before and stated, she "feels like there is a light at the end of the tunnel".  Patient reported she has adjusted to her daughters not being in the home. Patient reported she has been spending time with family and friends since last session. Patient stated, "I still have moments but not as many as before" in response to mood since last session. Patient stated, "I'm my biggest critic and I put myself down more than I deserve". Patient stated, "I'm ok with it" in response to recommendation for therapy. Patient stated, "that would be fine" in response to the recommendation for a referral to a psychiatrist. Patient stated, "a piece of mind" in response to goals for therapy. Patient stated, "not beating myself up all the time, feeling like a failure" and "not shutting down and not shutting  people out" in response to goals for therapy. Patient reported she would like to see a decrease in panic attacks.   Interventions: Motivational Interviewing. Clinician conducted session via WebEx video from clinician's home office. Patient provided verbal consent to proceed with telehealth session and participated in session from patient's home. Clinician reviewed diagnoses and treatment recommendations. Provided psycho education related to diagnoses and treatment. Clinician utilized motivational interviewing to explore potential goals for therapy. Clinician utilized a task centered approach in collaboration with patient to begin to develop goals for therapy.   Diagnosis:  Adjustment disorder with depressed mood  Generalized anxiety disorder   Plan: Patient is to utilize [employer] [facility], thought re-framing, relaxation techniques, mindfulness and coping strategies to decrease symptoms associated with their diagnosis. Frequency: bi-weekly  Modality: individual     Long-term goal:   To be determined during follow up appointment on 01/30/23  Short-term goal:  Identify, challenge, and replace negative core beliefs, thought patterns, and negative self talk that contribute to feelings of depression, anxiety, and low self esteem with positive thoughts, beliefs, and positive self talk per patient's report  Target Date: 07/10/23  Progress: progressing   Verbally express understanding of the relationship between feelings of depression, anxiety and their impact on thinking patterns and behaviors.  Target Date: 07/10/23  Progress: progressing   Develop effective communication strategies for patient to utilize when expressing her thoughts and feelings to others in a controlled and assertive way   Target Date: 07/10/23  Progress: progressing   Identify triggers for symptoms of anxiety, including panic attacks, and develop/implement  coping strategies to decrease feelings of anxiety, panic  attacks, and fear of not being in control per patient's report   Target Date: 07/10/23  Progress: progressing                       Katherina Right, LCSW

## 2023-01-30 ENCOUNTER — Ambulatory Visit: Payer: 59 | Admitting: Clinical

## 2023-02-04 ENCOUNTER — Encounter: Payer: Self-pay | Admitting: Family Medicine

## 2023-02-05 ENCOUNTER — Other Ambulatory Visit: Payer: Self-pay | Admitting: Family Medicine

## 2023-02-05 MED ORDER — WEGOVY 1 MG/0.5ML ~~LOC~~ SOAJ
1.0000 mg | SUBCUTANEOUS | 0 refills | Status: DC
  Filled 2023-02-05: qty 2, 28d supply, fill #0

## 2023-02-06 ENCOUNTER — Other Ambulatory Visit: Payer: Self-pay

## 2023-02-09 ENCOUNTER — Other Ambulatory Visit: Payer: Self-pay

## 2023-02-10 ENCOUNTER — Other Ambulatory Visit: Payer: Self-pay | Admitting: Family Medicine

## 2023-02-10 ENCOUNTER — Other Ambulatory Visit: Payer: Self-pay

## 2023-02-10 MED ORDER — WEGOVY 1.7 MG/0.75ML ~~LOC~~ SOAJ
1.7000 mg | SUBCUTANEOUS | 0 refills | Status: DC
  Filled 2023-02-10: qty 9, 84d supply, fill #0
  Filled 2023-02-12: qty 3, 28d supply, fill #0
  Filled 2023-03-04: qty 3, 28d supply, fill #1

## 2023-02-12 ENCOUNTER — Other Ambulatory Visit: Payer: Self-pay

## 2023-02-13 ENCOUNTER — Ambulatory Visit (INDEPENDENT_AMBULATORY_CARE_PROVIDER_SITE_OTHER): Payer: 59 | Admitting: Clinical

## 2023-02-13 DIAGNOSIS — F4321 Adjustment disorder with depressed mood: Secondary | ICD-10-CM

## 2023-02-13 DIAGNOSIS — F411 Generalized anxiety disorder: Secondary | ICD-10-CM | POA: Diagnosis not present

## 2023-02-13 NOTE — Progress Notes (Signed)
                Sharesa Kemp, LCSW 

## 2023-02-13 NOTE — Progress Notes (Signed)
[place] Counselor/Therapist Progress Note  Patient ID: NIEMAH DEARMAN, MRN: [id number],    Date: 02/13/2023  Time Spent: 8:35am - 9:19am : 44 minutes  Treatment Type: Individual Therapy  Reported Symptoms: Patient stated, "overall I've been feeling fine, ok".  Mental Status Exam: Appearance:  Well Groomed     Behavior: Appropriate  Motor: Normal  Speech/Language:  Clear and Coherent  Affect: Appropriate  Mood: normal  Thought process: normal  Thought content:   WNL  Sensory/Perceptual disturbances:   WNL  Orientation: oriented to person, place, and situation  Attention: Good  Concentration: Good  Memory: WNL  Fund of knowledge:  Good  Insight:   Good  Judgment:  Good  Impulse Control: Good   Risk Assessment: Danger to Self:  No Patient denied current suicidal ideation  Self-injurious Behavior: No Danger to Others: No Patient denied current homicidal ideation  Duty to Warn:no Physical Aggression / Violence:No  Access to Firearms a concern: No  Gang Involvement:No   Subjective: Patient stated,"I'm doing pretty good" in response to events since last session. Patient stated, "overall I've been feeling fine, ok". Patient reported symptoms of anxiety and depression are dependent upon her environment. Patient stated "right now I just don't feel like I'm in a good place in my life". Patient reported an individual at her place of work, her mother living in the home with patient, and her mother's expectation of patient to perform tasks for her are stressors and triggers for changes in patient's mood. Patient stated, "I feel like I'm dealing with a lot right now". Patient reported wanting to isolate herself. Patient reported she avoids responding to situations to prevent herself from responding out of emotion. Patient reported she avoids going home due to stress at home. Patient stated, "that's all I know how to do is take care of somebody" and stated, "I've never had  the chance to figure out what I want to do or like doing". Patient reported she previously enjoyed going for walks, listening to music, and enjoys dancing. Patient stated, "I'm ok with that" in response to resuming walks.  Patient agreed to treatment goals.   Interventions: Cognitive Behavioral Therapy and Motivational Interviewing. Clinician conducted session via WebEx video from clinician's home office. Patient provided verbal consent to proceed with telehealth session and participated in session from patient's car. Reviewed events since last session. Clinician utilized motivational interviewing to explore additional goals for therapy. Clinician utilized a task centered approach in collaboration with patient to finalize goals for therapy. Explored and identified triggers for symptoms of anxiety and depression. Explored and identified coping mechanisms patient utilizes in response to stressors. Explored activities patient previously enjoyed and identified strategies to increase self care, such as, going for a walk, listening to music, attending a concert.     Diagnosis:  Adjustment disorder with depressed mood   Generalized anxiety disorder     Plan: Patient is to utilize [employer] [facility], thought re-framing, relaxation techniques, mindfulness and coping strategies to decrease symptoms associated with Adjustment Disorder and Generalized Anxiety Disorder. Frequency: bi-weekly  Modality: individual      Long-term goal:   Reduce overall level, frequency, and intensity of the feelings of depression and anxiety as evidenced by decreased sadness, changes in energy, difficulty staying asleep at night, wanting to sleep all day, loss of interest, worry, difficulty controlling the worry, feeling on edge, restlessness, irritability, anxious when performing a task at work, increased heart rate when feeling anxious, shallow breathing when feeling anxious,  and anxiety when patient feel something is  out of her control from 2 days/week to 0 days/week per patient report for at least 3 consecutive months.   Target Date: 02/13/24  Progress: progressing   Short-term goal:  Identify, challenge, and replace negative core beliefs, thought patterns, and negative self talk that contribute to feelings of depression, anxiety, and low self esteem with positive thoughts, beliefs, and positive self talk per patient's report   Target Date: 07/10/23  Progress: progressing    Verbally express understanding of the relationship between feelings of depression, anxiety and their impact on thinking patterns and behaviors.   Target Date: 07/10/23  Progress: progressing    Develop effective communication strategies for patient to utilize when expressing her thoughts and feelings to others in a controlled and assertive way    Target Date: 07/10/23  Progress: progressing    Identify triggers for symptoms of anxiety, including panic attacks, and develop/implement coping strategies to decrease feelings of anxiety, panic attacks, and fear of not being in control per patient's report    Target Date: 07/10/23  Progress: progressing                                 Katherina Right, LCSW

## 2023-02-27 ENCOUNTER — Ambulatory Visit: Payer: 59 | Admitting: Clinical

## 2023-03-05 ENCOUNTER — Other Ambulatory Visit: Payer: Self-pay

## 2023-03-13 ENCOUNTER — Ambulatory Visit: Payer: Self-pay | Admitting: Clinical

## 2023-03-17 ENCOUNTER — Ambulatory Visit
Admission: EM | Admit: 2023-03-17 | Discharge: 2023-03-17 | Disposition: A | Discharge status: Home / Self Care | Payer: 59 | Attending: Physician Assistant | Admitting: Physician Assistant

## 2023-03-17 DIAGNOSIS — N760 Acute vaginitis: Secondary | ICD-10-CM

## 2023-03-17 DIAGNOSIS — N390 Urinary tract infection, site not specified: Secondary | ICD-10-CM

## 2023-03-17 DIAGNOSIS — B9689 Other specified bacterial agents as the cause of diseases classified elsewhere: Secondary | ICD-10-CM

## 2023-03-17 LAB — URINALYSIS, W/ REFLEX TO CULTURE (INFECTION SUSPECTED)
Glucose, UA: NEGATIVE mg/dL
Hgb urine dipstick: NEGATIVE
Leukocytes,Ua: NEGATIVE
Nitrite: NEGATIVE
Protein, ur: NEGATIVE mg/dL
RBC / HPF: NONE SEEN RBC/hpf (ref 0–5)
Specific Gravity, Urine: >1.03 — ABNORMAL HIGH (ref 1.005–1.030)
pH: 6 (ref 5.0–8.0)

## 2023-03-17 LAB — WET PREP, GENITAL
Sperm: NONE SEEN
Trich, Wet Prep: NONE SEEN
WBC, Wet Prep HPF POC: <10 — AB (ref <10)
Yeast Wet Prep HPF POC: NONE SEEN

## 2023-03-17 MED ORDER — FLUCONAZOLE 200 MG PO TABS
200.0000 mg | ORAL_TABLET | Freq: Once | ORAL | 1 refills | Status: AC
  Filled 2023-03-17: qty 1, 1d supply, fill #0

## 2023-03-17 MED ORDER — NITROFURANTOIN MONOHYD MACRO 100 MG PO CAPS
100.0000 mg | ORAL_CAPSULE | Freq: Two times a day (BID) | ORAL | 0 refills | Status: DC
  Filled 2023-03-17: qty 10, 5d supply, fill #0

## 2023-03-17 MED ORDER — METRONIDAZOLE 500 MG PO TABS
500.0000 mg | ORAL_TABLET | Freq: Two times a day (BID) | ORAL | 0 refills | Status: DC
  Filled 2023-03-17: qty 14, 7d supply, fill #0

## 2023-03-17 NOTE — ED Triage Notes (Signed)
Pt presents with c.o urinary frequency and vaginal discharge.  C/o intermittent rt lower abd pain X 1 week.   Pt denies taking OTC for relief and states nothing makes the pain better. Denies urine odor.

## 2023-03-17 NOTE — ED Provider Notes (Signed)
[facility]    CSN: [id number] Arrival date & time: 03/17/23  1850      History   Chief Complaint Chief Complaint  Patient presents with   Abdominal Pain   Vaginal Discharge    HPI Courtney Hanson is a 47 y.o. female.   Patient is a 47 year old female who presents with chief complaint of urinary urgency and frequency, vaginal discharge, and abdominal pain.  Patient states the frequency and urgency began about 2 days ago.  Patient denies any urinary pain.  Patient reports the vaginal discharge started about a week ago and describes it as white.  She denies any vaginal pain.  Patient reports abdominal pain began about a week ago and is intermittent.  She describes it as a dull achy pain in the right lower quadrant that may last 5 to 10 minutes and then is gone.  She denies any history of kidney stones.  She denies any blood in her urine no dark urine, and no foul smell.  Patient denies any allergies to medications.  She has not taken anything at home to treat the symptoms.  Patient denies any nausea, vomiting, diarrhea, or constipation.    Past Medical History:  Diagnosis Date   Anxiety    GERD (gastroesophageal reflux disease)    Lower extremity edema    Pulmonary embolism     Patient Active Problem List   Diagnosis Date Noted   History of DVT (deep vein thrombosis) 09/13/2022   IDA (iron deficiency anemia) 09/13/2022   Genetic testing 03/18/2022   History of pulmonary embolism 02/25/2022   Family history of breast cancer 02/25/2022   History of DVT of lower extremity 02/25/2022   Pulmonary embolism 02/11/2022   Primary insomnia 02/11/2022   Localized edema 02/11/2022   Chronic diarrhea    Adenomatous rectal polyp    Nausea without vomiting    Chronic GERD    Erosive gastritis    Prediabetes 10/18/2020   Chronic right-sided thoracic back pain 09/23/2019   Vitamin D deficiency 06/04/2017   Deep venous thrombosis of lower extremity 06/19/2016   Tachycardia  06/19/2016   Major depressive disorder, recurrent episode, moderate 07/13/2015   PMDD (premenstrual dysphoric disorder) 12/21/2013   GAD (generalized anxiety disorder) 06/24/2013   Morbid obesity with BMI of 60.0-69.9, adult 06/24/2013   Endometritis 05/31/2013    Past Surgical History:  Procedure Laterality Date   CHOLECYSTECTOMY, LAPAROSCOPIC N/A 2003   COLONOSCOPY WITH PROPOFOL N/A 10/17/2021   Procedure: COLONOSCOPY WITH PROPOFOL;  Surgeon: Toney ReilVanga, Rohini Reddy, MD;  [facility];  Service: Gastroenterology;  Laterality: N/A;   DILITATION & CURRETTAGE/HYSTROSCOPY WITH ESSURE     ESOPHAGOGASTRODUODENOSCOPY N/A 10/17/2021   Procedure: ESOPHAGOGASTRODUODENOSCOPY (EGD);  Surgeon: Toney ReilVanga, Rohini Reddy, MD;  Location[facility] END[facility]COPY;  Service: Gastroenterology;  Laterality: N/A;   LEEP N/A 2003    OB History   No obstetric history on file.      Home Medications    Prior to Admission medications   Medication Sig Start Date End Date Taking? Authorizing Provider  fluconazole (DIFLUCAN) 200 MG tablet Take 1 tablet (200 mg total) by mouth once for 1 dose. 03/17/23 03/17/23 Yes Candis SchatzHarris, Morrissa Shein D, PA-C  metroNIDAZOLE (FLAGYL) 500 MG tablet Take 1 tablet (500 mg total) by mouth 2 (two) times daily. 03/17/23  Yes Candis SchatzHarris, Cassandre Oleksy D, PA-C  nitrofurantoin, macrocrystal-monohydrate, (MACROBID) 100 MG capsule Take 1 capsule (100 mg total) by mouth 2 (two) times daily. 03/17/23  Yes Candis SchatzHarris, Annalisia Ingber D, PA-C  Semaglutide-Weight Management (WEGOVY) 1.7 MG/0.75ML SOAJ Inject 1.7 mg into the skin once a week. 02/10/23   Jeani Sow, MD  albuterol (VENTOLIN HFA) 108 [phone] Base) MCG/ACT inhaler SMARTSIG:2 Puff(s) By Mouth Every 6 Hours PRN 11/18/21   [provider]  ALPRAZolam Prudy Feeler) 0.5 MG tablet Take 1 tablet (0.5 mg total) by mouth 2 (two) times daily as needed. 11/07/22   Jeani Sow, MD  apixaban (ELIQUIS) 2.5 MG TABS tablet Take 1 tablet (2.5 mg total) by mouth 2 (two) times daily.  09/12/22   [name], MD  cyclobenzaprine (FLEXERIL) 5 MG tablet Take by mouth as needed.    [provider]  esomeprazole (NEXIUM) 40 MG capsule Take 1 capsule (40 mg total) by mouth daily before breakfast. 11/07/22   Jeani Sow, MD  FLUoxetine (PROZAC) 40 MG capsule Take 1 capsule (40 mg total) by mouth daily. 11/07/22   Jeani Sow, MD  furosemide (LASIX) 20 MG tablet Take 1 tablet (20 mg total) by mouth as needed. 02/11/22   Jeani Sow, MD  Iron-Vitamin C (VITRON-C) 65-125 MG TABS Take 1 tablet by mouth daily. 09/13/22   [name], MD  nystatin cream (MYCOSTATIN) Apply to affected area 2 times daily 09/17/22   Mickie Bail, NP  potassium chloride (MICRO-K) 10 MEQ CR capsule Take 2 capsules (20 mEq total) by mouth as needed. 02/11/22   Jeani Sow, MD  promethazine (PHENERGAN) 25 MG tablet Take 1 tablet (25 mg total) by mouth every six (6) hours as needed for nausea. 11/07/22   Jeani Sow, MD  zolpidem (AMBIEN CR) 6.25 MG CR tablet TAKE 1 TABLET BY MOUTH NIGHTLY AS NEEDED FOR SLEEP 11/07/22 05/06/23  Jeani Sow, MD    Family History Family History  Problem Relation Age of Onset   Hyperlipidemia Mother    Arthritis Mother    Hypertension Mother    Diabetes Mother    Hyperlipidemia Father    Alcohol abuse Father    Arthritis Father    Hypertension Father    Breast cancer Sister 7   Breast cancer Paternal Aunt    Stomach cancer Paternal Uncle    Cancer Paternal Uncle        unk type   Alcohol abuse Maternal Grandfather    Diabetes Paternal Grandmother    Throat cancer Paternal Grandfather        dx. 96s   Birth defects Paternal Grandfather    Alcohol abuse Paternal Grandfather     Social History Social History   Tobacco Use   Smoking status: Never   Smokeless tobacco: Never  Vaping Use   Vaping Use: Never used  Substance Use Topics   Alcohol use: Not Currently   Drug use: Never     Allergies   Tetanus toxoid, Topiramate, and  Venlafaxine   Review of Systems Review of Systems as noted above in HPI.  Other systems reviewed and found to be negative   Physical Exam Triage Vital Signs ED Triage Vitals  Enc Vitals Group     BP 03/17/23 1909 105/73     Pulse Rate 03/17/23 1909 (!) 104     Resp 03/17/23 1909 18     Temp 03/17/23 1909 98.6 F (37 C)     Temp Source 03/17/23 1909 Oral     SpO2 03/17/23 1909 97 %     Weight --      Height --      Head Circumference --  Peak Flow --      Pain Score 03/17/23 1908 5     Pain Loc --      Pain Edu? --      Excl. in GC? --    No data found.  Updated Vital Signs BP 105/73 (BP Location: Right Arm)   Pulse (!) 104   Temp 98.6 F (37 C) (Oral)   Resp 18   LMP 02/24/2023 (Exact Date)   SpO2 97%   Visual Acuity Right Eye Distance:   Left Eye Distance:   Bilateral Distance:    Right Eye Near:   Left Eye Near:    Bilateral Near:     Physical Exam Constitutional:      Appearance: She is well-developed.  Cardiovascular:     Rate and Rhythm: Regular rhythm. Tachycardia present.     Heart sounds: No murmur heard. Pulmonary:     Effort: Pulmonary effort is normal. No respiratory distress.  Abdominal:     Palpations: Abdomen is soft.     Tenderness: There is no abdominal tenderness. There is no right CVA tenderness or left CVA tenderness.  Skin:    General: Skin is warm and dry.  Neurological:     General: No focal deficit present.     Mental Status: She is alert and oriented to person, place, and time.      UC Treatments / Results  Labs (all labs ordered are listed, but only abnormal results are displayed) Labs Reviewed  WET PREP, GENITAL - Abnormal; Notable for the following components:      Result Value   Clue Cells Wet Prep HPF POC PRESENT (*)    WBC, Wet Prep HPF POC <10 (*)    All other components within normal limits  URINALYSIS, W/ REFLEX TO CULTURE (INFECTION SUSPECTED) - Abnormal; Notable for the following components:   Specific  Gravity, Urine >1.030 (*)    Bilirubin Urine SMALL (*)    Ketones, ur TRACE (*)    Bacteria, UA MANY (*)    All other components within normal limits    EKG   Radiology No results found.  Procedures Procedures (including critical care time)  Medications Ordered in UC Medications - No data to display  Initial Impression / Assessment and Plan / UC Course  I have reviewed the triage vital signs and the nursing notes.  Pertinent labs & imaging results that were available during my care of the patient were reviewed by me and considered in my medical decision making (see chart for details).    Patient presents with chief complaint of abdominal pain and vaginal discharge x 1 week.  Abdominal pain is intermittent lasting 5-10 minutes.  Patient states it seems that if she pushes on her right lower side deep enough that the pain seems to go away.  Patient reports it has a dull achy pain.  Vaginal discharge home white in color but no pain.  Urinary frequency and urgency but no urinary pain.  No hematuria no dark coloration to her urine and no foul smell.  Wet prep with clue cells consistent with bacterial cough.  Will give her prescription for Flagyl.  Also give her prescription for fluconazole due to history of yeast infections when taking antibiotics.  Urinalysis shows many bacteria and trace ketones.  Give patient a prescription for Macrobid and send urine for culture.  Also give her instructions for patient fluids given the ketones in her urine. Final Clinical Impressions(s) / UC Diagnoses   Final  diagnoses:  Bacterial vaginosis  Lower urinary tract infectious disease     Discharge Instructions      -For your bacterial vaginosis: Flagyl: 1 tablet twice a day for 7 days -For your urinary tract infection: Nitrofurantoin: 1 tablet twice a day for 5 days.  Urine to be sent for culture -Prophylactic fluconazole for history of yeast infections antibiotics: Take 1 tablet once.  May  repeat in 7 days if needed -Ibuprofen Tylenol as needed for pain and fever -Push fluids -Follow-up with this clinic or PCP should symptoms worsen or not improve.     ED Prescriptions     Medication Sig Dispense Auth. Provider   metroNIDAZOLE (FLAGYL) 500 MG tablet Take 1 tablet (500 mg total) by mouth 2 (two) times daily. 14 tablet Candis Schatz, PA-C   fluconazole (DIFLUCAN) 200 MG tablet Take 1 tablet (200 mg total) by mouth once for 1 dose. 1 tablet Candis Schatz, PA-C   nitrofurantoin, macrocrystal-monohydrate, (MACROBID) 100 MG capsule Take 1 capsule (100 mg total) by mouth 2 (two) times daily. 10 capsule Candis Schatz, PA-C      PDMP not reviewed this encounter.   Candis Schatz, PA-C 03/17/23 1950

## 2023-03-17 NOTE — Discharge Instructions (Addendum)
-  For your bacterial vaginosis: Flagyl: 1 tablet twice a day for 7 days -For your urinary tract infection: Nitrofurantoin: 1 tablet twice a day for 5 days.  Urine to be sent for culture -Prophylactic fluconazole for history of yeast infections antibiotics: Take 1 tablet once.  May repeat in 7 days if needed -Ibuprofen Tylenol as needed for pain and fever -Push fluids -Follow-up with this clinic or PCP should symptoms worsen or not improve.

## 2023-03-18 ENCOUNTER — Other Ambulatory Visit: Payer: Self-pay

## 2023-03-20 ENCOUNTER — Inpatient Hospital Stay: Payer: 59

## 2023-03-20 ENCOUNTER — Inpatient Hospital Stay: Payer: 59 | Admitting: Oncology

## 2023-03-20 NOTE — Assessment & Plan Note (Deleted)
Recommend Vitron C daily Repeat iron panel at next visit 

## 2023-03-23 ENCOUNTER — Encounter: Payer: Self-pay | Admitting: Oncology

## 2023-03-23 NOTE — Telephone Encounter (Signed)
Pt r/s to 5/10. Ok to refill Eliquis?

## 2023-03-24 ENCOUNTER — Other Ambulatory Visit: Payer: Self-pay

## 2023-03-24 MED ORDER — APIXABAN 2.5 MG PO TABS
2.5000 mg | ORAL_TABLET | Freq: Two times a day (BID) | ORAL | 0 refills | Status: DC
  Filled 2023-03-24: qty 60, 30d supply, fill #0

## 2023-04-17 ENCOUNTER — Inpatient Hospital Stay: Payer: 59 | Attending: Oncology

## 2023-04-17 ENCOUNTER — Other Ambulatory Visit: Payer: Self-pay

## 2023-04-17 ENCOUNTER — Encounter: Payer: Self-pay | Admitting: Oncology

## 2023-04-17 ENCOUNTER — Inpatient Hospital Stay (HOSPITAL_BASED_OUTPATIENT_CLINIC_OR_DEPARTMENT_OTHER): Payer: 59 | Admitting: Oncology

## 2023-04-17 ENCOUNTER — Telehealth: Payer: Self-pay

## 2023-04-17 VITALS — BP 107/73 | HR 86 | Temp 97.3°F | Resp 18

## 2023-04-17 DIAGNOSIS — D508 Other iron deficiency anemias: Secondary | ICD-10-CM

## 2023-04-17 DIAGNOSIS — Z7901 Long term (current) use of anticoagulants: Secondary | ICD-10-CM | POA: Diagnosis not present

## 2023-04-17 DIAGNOSIS — Z86718 Personal history of other venous thrombosis and embolism: Secondary | ICD-10-CM

## 2023-04-17 DIAGNOSIS — Z79899 Other long term (current) drug therapy: Secondary | ICD-10-CM | POA: Insufficient documentation

## 2023-04-17 DIAGNOSIS — E669 Obesity, unspecified: Secondary | ICD-10-CM | POA: Diagnosis not present

## 2023-04-17 DIAGNOSIS — Z86711 Personal history of pulmonary embolism: Secondary | ICD-10-CM | POA: Diagnosis not present

## 2023-04-17 DIAGNOSIS — Z803 Family history of malignant neoplasm of breast: Secondary | ICD-10-CM | POA: Insufficient documentation

## 2023-04-17 LAB — CBC WITH DIFFERENTIAL/PLATELET
Abs Immature Granulocytes: 0.02 10*3/uL (ref 0.00–0.07)
Basophils Absolute: 0 10*3/uL (ref 0.0–0.1)
Basophils Relative: 1 %
Eosinophils Absolute: 0.1 10*3/uL (ref 0.0–0.5)
Eosinophils Relative: 1 %
HCT: 34.6 % — ABNORMAL LOW (ref 36.0–46.0)
Hemoglobin: 10.5 g/dL — ABNORMAL LOW (ref 12.0–15.0)
Immature Granulocytes: 0 %
Lymphocytes Relative: 42 %
Lymphs Abs: 2.3 10*3/uL (ref 0.7–4.0)
MCH: 25.1 pg — ABNORMAL LOW (ref 26.0–34.0)
MCHC: 30.3 g/dL (ref 30.0–36.0)
MCV: 82.6 fL (ref 80.0–100.0)
Monocytes Absolute: 0.5 10*3/uL (ref 0.1–1.0)
Monocytes Relative: 9 %
Neutro Abs: 2.5 10*3/uL (ref 1.7–7.7)
Neutrophils Relative %: 47 %
Platelets: 288 10*3/uL (ref 150–400)
RBC: 4.19 MIL/uL (ref 3.87–5.11)
RDW: 15.4 % (ref 11.5–15.5)
WBC: 5.4 10*3/uL (ref 4.0–10.5)
nRBC: 0 % (ref 0.0–0.2)

## 2023-04-17 LAB — COMPREHENSIVE METABOLIC PANEL
ALT: 16 U/L (ref 0–44)
AST: 22 U/L (ref 15–41)
Albumin: 3.7 g/dL (ref 3.5–5.0)
Alkaline Phosphatase: 45 U/L (ref 38–126)
Anion gap: 11 (ref 5–15)
BUN: 13 mg/dL (ref 6–20)
CO2: 23 mmol/L (ref 22–32)
Calcium: 8.8 mg/dL — ABNORMAL LOW (ref 8.9–10.3)
Chloride: 102 mmol/L (ref 98–111)
Creatinine, Ser: 0.74 mg/dL (ref 0.44–1.00)
GFR, Estimated: >60 mL/min (ref >60)
Glucose, Bld: 109 mg/dL — ABNORMAL HIGH (ref 70–99)
Potassium: 3.8 mmol/L (ref 3.5–5.1)
Sodium: 136 mmol/L (ref 135–145)
Total Bilirubin: 1.1 mg/dL (ref 0.3–1.2)
Total Protein: 7.8 g/dL (ref 6.5–8.1)

## 2023-04-17 LAB — IRON AND TIBC
Iron: 52 ug/dL (ref 28–170)
Saturation Ratios: 11 % (ref 10.4–31.8)
TIBC: 484 ug/dL — ABNORMAL HIGH (ref 250–450)
UIBC: 432 ug/dL

## 2023-04-17 LAB — FERRITIN: Ferritin: 6 ng/mL — ABNORMAL LOW (ref 11–307)

## 2023-04-17 MED ORDER — APIXABAN 2.5 MG PO TABS
2.5000 mg | ORAL_TABLET | Freq: Two times a day (BID) | ORAL | 5 refills | Status: DC
  Filled 2023-04-17: qty 60, 30d supply, fill #0
  Filled 2023-05-25: qty 60, 30d supply, fill #1
  Filled 2023-07-02: qty 60, 30d supply, fill #2
  Filled 2023-08-05: qty 60, 30d supply, fill #3
  Filled 2023-09-03: qty 60, 30d supply, fill #4

## 2023-04-17 NOTE — Assessment & Plan Note (Addendum)
Recommend Vitron C daily, she is currently not taking on a daily basis. We discussed the option of IV Venofer treatments.  She prefers to try oral iron supplementation first and repeat levels in 4 months

## 2023-04-17 NOTE — Telephone Encounter (Signed)
Please move Nov appt up to be in 4 months: lab/MD. Please inform pt of new appt details, Dr.Yu has talked to pt and informed her of plan.

## 2023-04-17 NOTE — Progress Notes (Signed)
Hematology/Oncology Progress note [phone] [phone]      CHIEF COMPLAINTS/REASON FOR VISIT:  pulmonary embolism and DVT, iron deficiency anemia.   ASSESSMENT & PLAN:   IDA (iron deficiency anemia) Recommend Vitron C daily, she is currently not taking on a daily basis. We discussed the option of IV Venofer treatments.  She prefers to try oral iron supplementation first and repeat levels in 4 months  History of DVT (deep vein thrombosis) Continue Eliquis 2.5 mg twice daily.  Orders Placed This Encounter  Procedures   Ferritin    Standing Status:   Future    Number of Occurrences:   1    Standing Expiration Date:   04/16/2024   CBC with Differential (Cancer Center Only)    Standing Status:   Future    Standing Expiration Date:   04/16/2024   CMP (Cancer Center only)    Standing Status:   Future    Standing Expiration Date:   04/16/2024   Iron and TIBC    Standing Status:   Future    Standing Expiration Date:   04/16/2024   Ferritin    Standing Status:   Future    Standing Expiration Date:   04/16/2024   Follow up in 4 months.  All questions were answered. The patient knows to call the clinic with any problems, questions or concerns.  [name], MD, PhD [facility] Hematology Oncology 04/17/2023   HISTORY OF PRESENTING ILLNESS:   Courtney Hanson is a  47 y.o.  female with PMH listed below was seen in consultation at the request of  Jeani Sow, MD  for evaluation of history of pulmonary embolism and DVT.  Patient has remote history of 2 episodes of lower extremity DVT. Per patient, first episode was in 2003, she developed acute left lower extremity calf vein DVT after gallbladder surgery.  She was treated with 3 months of anticoagulation  04/09/2013, 05/31/2013 bilateral lower extremity venous duplex showed no right extremity DVT.  Abnormalities consistent with the sequela of the prior venous obstructive process with findings that appear  chronic/longstanding in nature and are not identified in the proximal vein.  05/20/2016, second episode of acute left lower extremity DVT, provoked by birth control pills.  Patient was treated with Eliquis for a period of time.  05/04/2020.She developed shortness of breath and chest pain.  Her work-up showed unprovoked pulmonary embolism on  Patient was started on anticoagulation and has been on Eliquis since then.  Currently she is on Eliquis 5 mg twice daily.  She tolerates well.  Denies any active bleeding events.  Patient has had hypercoagulable work-up done in the past.  Negative factor V Leiden mutation, prothrombin gene mutation. She had  adequate Antithrombin 3 level, protein C and S activity. She was seen by hematology at [facility] on 10/08/2020.  Antiphospholipid panel was obtained by hematology was negative for lupus anticoagulant, negative anticardiolipin IgM/IgG, negative beta 2 glycoprotein protein IgG/IgM  Patient has chronic varicose veins on her bilateral lower extremity. Patient denies any shortness of breath.  She has experienced left anterior chest wall pain which she attributes to " pulled muscle".  Patient has family history of breast cancer in sister.  INTERVAL HISTORY Courtney Hanson is a 47 y.o. female who has above history reviewed by me today presents for follow up visit for history of DVT/PE Patient takes eliquis 2.5mg  BID, tolerates well. She denies any bleeding events, chest pain, SOB, leg edema more than her baseline  level.  Patient currently takes vitamin C intermittently.  Review of Systems  Constitutional:  Negative for appetite change, chills, fatigue and fever.  HENT:   Negative for hearing loss and voice change.   Eyes:  Negative for eye problems.  Respiratory:  Negative for chest tightness and cough.   Cardiovascular:  Negative for chest pain.  Gastrointestinal:  Negative for abdominal distention, abdominal pain and blood in stool.  Endocrine: Negative for hot  flashes.  Genitourinary:  Negative for difficulty urinating and frequency.   Musculoskeletal:  Negative for arthralgias.  Skin:  Negative for itching and rash.  Neurological:  Negative for extremity weakness.  Hematological:  Negative for adenopathy.  Psychiatric/Behavioral:  Negative for confusion.     MEDICAL HISTORY:  Past Medical History:  Diagnosis Date   Anxiety    GERD (gastroesophageal reflux disease)    Lower extremity edema    Pulmonary embolism (HCC)     SURGICAL HISTORY: Past Surgical History:  Procedure Laterality Date   CHOLECYSTECTOMY, LAPAROSCOPIC N/A 2003   COLONOSCOPY WITH PROPOFOL N/A 10/17/2021   Procedure: COLONOSCOPY WITH PROPOFOL;  Surgeon: Toney Reil, MD;  Location: [facility] ENDOSCOPY;  Service: Gastroenterology;  Laterality: N/A;   DILITATION & CURRETTAGE/HYSTROSCOPY WITH ESSURE     ESOPHAGOGASTRODUODENOSCOPY N/A 10/17/2021   Procedure: ESOPHAGOGASTRODUODENOSCOPY (EGD);  Surgeon: Toney Reil, MD;  Location: [facility] ENDOSCOPY;  Service: Gastroenterology;  Laterality: N/A;   LEEP N/A 2003    SOCIAL HISTORY: Social History   Socioeconomic History   Marital status: Single    Spouse name: Not on file   Number of children: 4   Years of education: Not on file   Highest education level: Not on file  Occupational History   Not on file  Tobacco Use   Smoking status: Never   Smokeless tobacco: Never  Vaping Use   Vaping Use: Never used  Substance and Sexual Activity   Alcohol use: Not Currently   Drug use: Never   Sexual activity: Yes    Birth control/protection: Surgical    Comment: essure  Other Topics Concern   Not on file  Social History Narrative   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 [place]   Social Determinants of Health   Financial Resource Strain: Not on file  Food Insecurity: Not on file  Transportation Needs: Not on file  Physical Activity: Not on file  Stress: Not on file  Social Connections: Not on file  Intimate  Partner Violence: Not on file    FAMILY HISTORY: Family History  Problem Relation Age of Onset   Hyperlipidemia Mother    Arthritis Mother    Hypertension Mother    Diabetes Mother    Hyperlipidemia Father    Alcohol abuse Father    Arthritis Father    Hypertension Father    Breast cancer Sister 72   Breast cancer Paternal Aunt    Stomach cancer Paternal Uncle    Cancer Paternal Uncle        unk type   Alcohol abuse Maternal Grandfather    Diabetes Paternal Grandmother    Throat cancer Paternal Grandfather        dx. 73s   Birth defects Paternal Grandfather    Alcohol abuse Paternal Grandfather     ALLERGIES:  is allergic to tetanus toxoid, topiramate, and venlafaxine.  MEDICATIONS:  Current Outpatient Medications  Medication Sig Dispense Refill   albuterol (VENTOLIN HFA) 108 (90 Base) MCG/ACT inhaler SMARTSIG:2 Puff(s) By Mouth Every 6 Hours PRN  ALPRAZolam (XANAX) 0.5 MG tablet Take 1 tablet (0.5 mg total) by mouth 2 (two) times daily as needed. 30 tablet 1   cyclobenzaprine (FLEXERIL) 5 MG tablet Take by mouth as needed.     esomeprazole (NEXIUM) 40 MG capsule Take 1 capsule (40 mg total) by mouth daily before breakfast. 90 capsule 1   FLUoxetine (PROZAC) 40 MG capsule Take 1 capsule (40 mg total) by mouth daily. 90 capsule 3   furosemide (LASIX) 20 MG tablet Take 1 tablet (20 mg total) by mouth as needed. 30 tablet 3   Iron-Vitamin C (VITRON-C) 65-125 MG TABS Take 1 tablet by mouth daily. 90 tablet 1   potassium chloride (MICRO-K) 10 MEQ CR capsule Take 2 capsules (20 mEq total) by mouth as needed. 30 capsule 3   promethazine (PHENERGAN) 25 MG tablet Take 1 tablet (25 mg total) by mouth every six (6) hours as needed for nausea. 30 tablet 2   Semaglutide-Weight Management (WEGOVY) 1.7 MG/0.75ML SOAJ Inject 1.7 mg into the skin once a week. 9 mL 0   zolpidem (AMBIEN CR) 6.25 MG CR tablet TAKE 1 TABLET BY MOUTH NIGHTLY AS NEEDED FOR SLEEP 30 tablet 1   apixaban  (ELIQUIS) 2.5 MG TABS tablet Take 1 tablet (2.5 mg total) by mouth 2 (two) times daily. 60 tablet 5   No current facility-administered medications for this visit.     PHYSICAL EXAMINATION: ECOG PERFORMANCE STATUS: 0 - Asymptomatic Vitals per epic record  Physical Exam Constitutional:      General: She is not in acute distress.    Appearance: She is obese.  HENT:     Head: Normocephalic and atraumatic.  Eyes:     General: No scleral icterus. Cardiovascular:     Rate and Rhythm: Normal rate and regular rhythm.     Heart sounds: Normal heart sounds.  Pulmonary:     Effort: Pulmonary effort is normal. No respiratory distress.     Breath sounds: No wheezing.  Abdominal:     General: Bowel sounds are normal. There is no distension.     Palpations: Abdomen is soft.  Musculoskeletal:        General: No deformity. Normal range of motion.     Cervical back: Normal range of motion and neck supple.     Comments: Right lower extremity varicose vein  Skin:    General: Skin is warm and dry.     Findings: No erythema or rash.  Neurological:     Mental Status: She is alert and oriented to person, place, and time. Mental status is at baseline.     Cranial Nerves: No cranial nerve deficit.     Coordination: Coordination normal.  Psychiatric:        Mood and Affect: Mood normal.     LABORATORY DATA:  I have reviewed the data as listed Lab Results  Component Value Date   WBC 5.4 04/17/2023   HGB 10.5 (L) 04/17/2023   HCT 34.6 (L) 04/17/2023   MCV 82.6 04/17/2023   PLT 288 04/17/2023   Recent Labs    09/12/22 1153 04/17/23 1205  NA 138 136  K 4.0 3.8  CL 109 102  CO2 28 23  GLUCOSE 108* 109*  BUN 11 13  CREATININE 0.65 0.74  CALCIUM 9.0 8.8*  GFRNONAA >60 >60  PROT 7.7 7.8  ALBUMIN 3.7 3.7  AST 16 22  ALT 12 16  ALKPHOS 50 45  BILITOT 0.7 1.1    Iron/TIBC/Ferritin/ %Sat  Component Value Date/Time   IRON 52 04/17/2023 1205   TIBC 484 (H) 04/17/2023 1205    FERRITIN 6 (L) 04/17/2023 1205   IRONPCTSAT 11 04/17/2023 1205      RADIOGRAPHIC STUDIES: I have personally reviewed the radiological images as listed and agreed with the findings in the report. No results found.    ASSESSMENT & PLAN:  1. Other iron deficiency anemia   2. History of DVT (deep vein thrombosis)    #History of unprovoked pulmonary embolism, history of recurrent provoked DVT of lower extremities. Patient has other risk factors for thrombosis  Recommend long term anticoagulation prophylaxis with Eliquis 2.5mg  BID  # IDA  Recommend Vitron C daily Repeat iron panel at next visit # Obesity, recommend lifestyle modification including diet and exercise.  # Family history of breast cancer, patient is up-to-date for mammogram annually. 03/18/22 genetic testing negative.   Orders Placed This Encounter  Procedures   Ferritin    Standing Status:   Future    Number of Occurrences:   1    Standing Expiration Date:   04/16/2024   CBC with Differential (Cancer Center Only)    Standing Status:   Future    Standing Expiration Date:   04/16/2024   CMP (Cancer Center only)    Standing Status:   Future    Standing Expiration Date:   04/16/2024   Iron and TIBC    Standing Status:   Future    Standing Expiration Date:   04/16/2024   Ferritin    Standing Status:   Future    Standing Expiration Date:   04/16/2024    All questions were answered. The patient knows to call the clinic with any problems questions or concerns.  cc Jeani Sow, MD    Return of visit: 6 months.   [name], MD, PhD [facility] Hematology Oncology 04/17/2023

## 2023-04-17 NOTE — Telephone Encounter (Signed)
-----   Message from [name], MD sent at 04/17/2023  3:26 PM EDT ----- Iron level is low.  I have called the patient and recommended removal her next appointment to 4 months.  Keep same labs.  Thank you

## 2023-04-17 NOTE — Assessment & Plan Note (Signed)
Continue Eliquis 2.5 mg twice daily.  

## 2023-04-17 NOTE — Assessment & Plan Note (Signed)
Continue Eliquis 2.5 mg daily.

## 2023-04-22 ENCOUNTER — Other Ambulatory Visit: Payer: Self-pay

## 2023-04-23 ENCOUNTER — Other Ambulatory Visit: Payer: Self-pay

## 2023-04-24 ENCOUNTER — Other Ambulatory Visit: Payer: Self-pay

## 2023-05-07 ENCOUNTER — Encounter: Payer: Self-pay | Admitting: Family Medicine

## 2023-05-11 ENCOUNTER — Other Ambulatory Visit (HOSPITAL_BASED_OUTPATIENT_CLINIC_OR_DEPARTMENT_OTHER): Payer: Self-pay

## 2023-05-22 ENCOUNTER — Other Ambulatory Visit: Payer: Self-pay | Admitting: *Deleted

## 2023-05-22 ENCOUNTER — Encounter: Payer: Self-pay | Admitting: Family Medicine

## 2023-05-22 ENCOUNTER — Other Ambulatory Visit: Payer: Self-pay

## 2023-05-22 MED ORDER — FLUOXETINE HCL 20 MG PO CAPS
20.0000 mg | ORAL_CAPSULE | Freq: Every day | ORAL | 3 refills | Status: DC
  Filled 2023-05-22: qty 90, 90d supply, fill #0
  Filled 2023-08-05: qty 90, 90d supply, fill #1

## 2023-05-22 NOTE — Telephone Encounter (Signed)
Patient notified of message below. Patient stated to send in the 20 mg so she can take 60 mg. Patient stated she will call back to schedule follow-up.

## 2023-05-25 ENCOUNTER — Other Ambulatory Visit: Payer: Self-pay

## 2023-05-25 ENCOUNTER — Other Ambulatory Visit: Payer: Self-pay | Admitting: Family Medicine

## 2023-05-25 DIAGNOSIS — K219 Gastro-esophageal reflux disease without esophagitis: Secondary | ICD-10-CM

## 2023-05-25 MED ORDER — ESOMEPRAZOLE MAGNESIUM 40 MG PO CPDR
40.0000 mg | DELAYED_RELEASE_CAPSULE | Freq: Every day | ORAL | 0 refills | Status: DC
  Filled 2023-05-25: qty 90, 90d supply, fill #0

## 2023-05-25 NOTE — Telephone Encounter (Signed)
Needs to schedule appt

## 2023-05-25 NOTE — Telephone Encounter (Signed)
Called and informed pt of message. Pt verbalized understanding and stated she would call back to schedule later this week.

## 2023-05-29 DIAGNOSIS — H52223 Regular astigmatism, bilateral: Secondary | ICD-10-CM | POA: Diagnosis not present

## 2023-05-29 DIAGNOSIS — H524 Presbyopia: Secondary | ICD-10-CM | POA: Diagnosis not present

## 2023-06-29 ENCOUNTER — Ambulatory Visit
Admission: EM | Admit: 2023-06-29 | Discharge: 2023-06-29 | Disposition: A | Discharge status: Home / Self Care | Payer: 59 | Attending: Physician Assistant | Admitting: Physician Assistant

## 2023-06-29 DIAGNOSIS — R109 Unspecified abdominal pain: Secondary | ICD-10-CM | POA: Insufficient documentation

## 2023-06-29 DIAGNOSIS — Z113 Encounter for screening for infections with a predominantly sexual mode of transmission: Secondary | ICD-10-CM | POA: Diagnosis not present

## 2023-06-29 DIAGNOSIS — R197 Diarrhea, unspecified: Secondary | ICD-10-CM | POA: Insufficient documentation

## 2023-06-29 LAB — POCT URINALYSIS DIP (MANUAL ENTRY)
Bilirubin, UA: NEGATIVE
Blood, UA: NEGATIVE
Glucose, UA: NEGATIVE mg/dL
Ketones, POC UA: NEGATIVE mg/dL
Leukocytes, UA: NEGATIVE
Nitrite, UA: NEGATIVE
Protein Ur, POC: NEGATIVE mg/dL
Spec Grav, UA: 1.02 (ref 1.010–1.025)
Urobilinogen, UA: 0.2 E.U./dL
pH, UA: 6.5 (ref 5.0–8.0)

## 2023-06-29 MED ORDER — DICYCLOMINE HCL 20 MG PO TABS
20.0000 mg | ORAL_TABLET | Freq: Two times a day (BID) | ORAL | 0 refills | Status: DC
  Filled 2023-06-29: qty 20, 10d supply, fill #0

## 2023-06-29 NOTE — Discharge Instructions (Signed)
Take dicyclomine twice daily to help with your symptoms.  Eat a bland diet and avoid spicy/acidic/fatty foods.  Make sure that you drink plenty of fluid.  If your symptoms or not improving within a few days please follow-up with either our clinic or primary care.  You may benefit from seeing a gastroenterologist to call them to schedule an appointment if your symptoms continue.  If anything worsens and you have fever, severe pain, nausea/vomiting, blood in your stool you need to go to the emergency room.

## 2023-06-29 NOTE — ED Triage Notes (Addendum)
Pt presents with c/o urinary frequency and intermittent abd pain X 1 week.   Pt has not taken anything for abd pain.   Denies N&V,  has had diarrhea.   Pt is requesting STD testing, denies sxs.

## 2023-06-29 NOTE — ED Provider Notes (Signed)
[facility]    CSN: [id number] Arrival date & time: 06/29/23  1750      History   Chief Complaint Chief Complaint  Patient presents with   SEXUALLY TRANSMITTED DISEASE    HPI Courtney Hanson is a 47 y.o. female.   Patient presents today with a 1 week history of intermittent left-sided abdominal pain.  She reports that pain is intermittent without identifiable trigger but during episodes is rated 6 on a 0-10 pain scale, described as sharp/cramping, no alleviating factors identified.  She has not tried any over-the-counter medication for symptom management.  She does report some urinary frequency but denies any dysuria, hematuria, urinary urgency.  Denies any pelvic pain, abdominal discharge.  She is interested in STI testing.  She is sexually active with female partners but does not always use condoms.  She is status postcholecystectomy and denies any history of gastrointestinal disorder.  She did have a colonoscopy in 2022 that showed polyp but no evidence of diverticulosis or other abnormalities.  She denies any medication changes, antibiotic use, suspicious food intake, known sick contacts, recent travel.  Denies any associated melena, hematochezia, mucus in stool.  She does take [employer] but has been stable on this dose denies any recent medication adjustment.    Past Medical History:  Diagnosis Date   Anxiety    GERD (gastroesophageal reflux disease)    Lower extremity edema    Pulmonary embolism [facility])     Patient Active Problem List   Diagnosis Date Noted   History of DVT (deep vein thrombosis) 09/13/2022   IDA (iron deficiency anemia) 09/13/2022   Genetic testing 03/18/2022   History of pulmonary embolism 02/25/2022   Family history of breast cancer 02/25/2022   History of DVT of lower extremity 02/25/2022   Pulmonary embolism (HCC) 02/11/2022   Primary insomnia 02/11/2022   Localized edema 02/11/2022   Chronic diarrhea    Adenomatous rectal polyp    Nausea  without vomiting    Chronic GERD    Erosive gastritis    Prediabetes 10/18/2020   Chronic right-sided thoracic back pain 09/23/2019   Vitamin D deficiency 06/04/2017   Tachycardia 06/19/2016   Major depressive disorder, recurrent episode, moderate (HCC) 07/13/2015   PMDD (premenstrual dysphoric disorder) 12/21/2013   GAD (generalized anxiety disorder) 06/24/2013   Morbid obesity with BMI of 60.0-69.9, adult (HCC) 06/24/2013   Endometritis 05/31/2013    Past Surgical History:  Procedure Laterality Date   CHOLECYSTECTOMY, LAPAROSCOPIC N/A 2003   COLONOSCOPY WITH PROPOFOL N/A 10/17/2021   Procedure: COLONOSCOPY WITH PROPOFOL;  Surgeon: Toney Reil, MD;  Location: [facility] ENDOSCOPY;  Service: Gastroenterology;  Laterality: N/A;   DILITATION & CURRETTAGE/HYSTROSCOPY WITH ESSURE     ESOPHAGOGASTRODUODENOSCOPY N/A 10/17/2021   Procedure: ESOPHAGOGASTRODUODENOSCOPY (EGD);  Surgeon: Toney Reil, MD;  Location: [facility] ENDOSCOPY;  Service: Gastroenterology;  Laterality: N/A;   LEEP N/A 2003    OB History   No obstetric history on file.      Home Medications    Prior to Admission medications   Medication Sig Start Date End Date Taking? Authorizing Provider  dicyclomine (BENTYL) 20 MG tablet Take 1 tablet (20 mg total) by mouth 2 (two) times daily. 06/29/23  Yes Eran Mistry K, PA-C  FLUoxetine (PROZAC) 20 MG capsule Take 1 capsule (20 mg total) by mouth daily. 05/22/23   Jeani Sow, MD  albuterol (VENTOLIN HFA) 108 [phone] Base) MCG/ACT inhaler SMARTSIG:2 Puff(s) By Mouth Every 6 Hours PRN 11/18/21  [provider]  ALPRAZolam Prudy Feeler) 0.5 MG tablet Take 1 tablet (0.5 mg total) by mouth 2 (two) times daily as needed. 11/07/22   Jeani Sow, MD  apixaban (ELIQUIS) 2.5 MG TABS tablet Take 1 tablet (2.5 mg total) by mouth 2 (two) times daily. 04/17/23   [name], MD  cyclobenzaprine (FLEXERIL) 5 MG tablet Take by mouth as needed.    [provider]   esomeprazole (NEXIUM) 40 MG capsule Take 1 capsule (40 mg total) by mouth daily before breakfast. 05/25/23   Jeani Sow, MD  FLUoxetine (PROZAC) 40 MG capsule Take 1 capsule (40 mg total) by mouth daily. 11/07/22   Jeani Sow, MD  furosemide (LASIX) 20 MG tablet Take 1 tablet (20 mg total) by mouth as needed. 02/11/22   Jeani Sow, MD  Iron-Vitamin C (VITRON-C) 65-125 MG TABS Take 1 tablet by mouth daily. 09/13/22   [name], MD  potassium chloride (MICRO-K) 10 MEQ CR capsule Take 2 capsules (20 mEq total) by mouth as needed. 02/11/22   Jeani Sow, MD  promethazine (PHENERGAN) 25 MG tablet Take 1 tablet (25 mg total) by mouth every six (6) hours as needed for nausea. 11/07/22   Jeani Sow, MD  Semaglutide-Weight Management [facility]) 1.7 MG/0.75ML SOAJ Inject 1.7 mg into the skin once a week. 02/10/23   Jeani Sow, MD  zolpidem (AMBIEN CR) 6.25 MG CR tablet TAKE 1 TABLET BY MOUTH NIGHTLY AS NEEDED FOR SLEEP 11/07/22 05/06/23  Jeani Sow, MD    Family History Family History  Problem Relation Age of Onset   Hyperlipidemia Mother    Arthritis Mother    Hypertension Mother    Diabetes Mother    Hyperlipidemia Father    Alcohol abuse Father    Arthritis Father    Hypertension Father    Breast cancer Sister 8   Breast cancer Paternal Aunt    Stomach cancer Paternal Uncle    Cancer Paternal Uncle        unk type   Alcohol abuse Maternal Grandfather    Diabetes Paternal Grandmother    Throat cancer Paternal Grandfather        dx. 35s   Birth defects Paternal Grandfather    Alcohol abuse Paternal Grandfather     Social History Social History   Tobacco Use   Smoking status: Never   Smokeless tobacco: Never  Vaping Use   Vaping status: Never Used  Substance Use Topics   Alcohol use: Not Currently   Drug use: Never     Allergies   Tetanus toxoid, Topiramate, and Venlafaxine   Review of Systems Review of Systems  Constitutional:  Positive for  activity change. Negative for appetite change, fatigue and fever.  Respiratory:  Negative for cough and shortness of breath.   Cardiovascular:  Negative for chest pain.  Gastrointestinal:  Positive for abdominal pain and diarrhea. Negative for blood in stool, constipation, nausea and vomiting.  Genitourinary:  Positive for frequency. Negative for dysuria, pelvic pain, urgency, vaginal bleeding, vaginal discharge and vaginal pain.  Musculoskeletal:  Negative for arthralgias, back pain and myalgias.     Physical Exam Triage Vital Signs ED Triage Vitals  Encounter Vitals Group     BP 06/29/23 1902 119/81     Systolic BP Percentile --      Diastolic BP Percentile --      Pulse Rate 06/29/23 1902 95     Resp 06/29/23 1902 17  Temp --      Temp src --      SpO2 06/29/23 1902 97 %     Weight --      Height --      Head Circumference --      Peak Flow --      Pain Score 06/29/23 1901 3     Pain Loc --      Pain Education --      Exclude from Growth Chart --    No data found.  Updated Vital Signs BP 119/81 (BP Location: Left Arm)   Pulse 95   Resp 17   LMP 06/13/2023 (Exact Date)   SpO2 97%   Visual Acuity Right Eye Distance:   Left Eye Distance:   Bilateral Distance:    Right Eye Near:   Left Eye Near:    Bilateral Near:     Physical Exam Vitals reviewed.  Constitutional:      General: She is awake. She is not in acute distress.    Appearance: Normal appearance. She is well-developed. She is not ill-appearing.     Comments: Very pleasant female appears stated age in no acute distress sitting comfortably in exam room  HENT:     Head: Normocephalic and atraumatic.  Cardiovascular:     Rate and Rhythm: Normal rate and regular rhythm.     Heart sounds: Normal heart sounds, S1 normal and S2 normal. No murmur heard. Pulmonary:     Effort: Pulmonary effort is normal.     Breath sounds: Normal breath sounds. No wheezing, rhonchi or rales.     Comments: Clear to  auscultation bilaterally Abdominal:     General: Bowel sounds are normal.     Palpations: Abdomen is soft.     Tenderness: There is abdominal tenderness in the left upper quadrant and left lower quadrant. There is no right CVA tenderness, left CVA tenderness, guarding or rebound.     Comments: Mild tenderness palpation left abdomen.  No evidence of acute abdomen on physical exam.  Psychiatric:        Behavior: Behavior is cooperative.      UC Treatments / Results  Labs (all labs ordered are listed, but only abnormal results are displayed) Labs Reviewed  RPR  HIV ANTIBODY (ROUTINE TESTING W REFLEX)  POCT URINALYSIS DIP (MANUAL ENTRY)  CERVICOVAGINAL ANCILLARY ONLY    EKG   Radiology No results found.  Procedures Procedures (including critical care time)  Medications Ordered in UC Medications - No data to display  Initial Impression / Assessment and Plan / UC Course  I have reviewed the triage vital signs and the nursing notes.  Pertinent labs & imaging results that were available during my care of the patient were reviewed by me and considered in my medical decision making (see chart for details).     Patient is well-appearing, afebrile, nontoxic, nontachycardic.  Vital signs and physical exam are reassuring today with no indication for emergent evaluation or imaging.  Patient denies any pelvic pain so pelvic exam was deferred.  Her urine was normal with no evidence of infection or dehydration.  STI testing was obtained per her request and is pending.  Given her tenderness over the left abdomen I am more concerned for irritable bowel as etiology of symptoms.  Low suspicion for diverticulitis given she is afebrile without significant pain and had a colonoscopy without mention of diverticulosis within the past 2 years.  Will treat symptomatically with dicyclomine.  Recommend she  eat a bland diet and drink plenty of fluids.  Discussed that if her symptoms or not improving quickly  she should follow-up with GI specialist for further evaluation and imaging since we do not have imaging to abilities in urgent care.  She was given the contact information for someone with instruction to call to schedule appointment.  We discussed that if anything worsens and she has fever, severe pain, melena, hematochezia, nausea/vomiting interfering with oral intake, weakness she needs to go to the emergency room.  Strict return precautions given.  Patient declined work excuse note.  Final Clinical Impressions(s) / UC Diagnoses   Final diagnoses:  Left sided abdominal pain  Diarrhea, unspecified type  Screening examination for STI     Discharge Instructions      Take dicyclomine twice daily to help with your symptoms.  Eat a bland diet and avoid spicy/acidic/fatty foods.  Make sure that you drink plenty of fluid.  If your symptoms or not improving within a few days please follow-up with either our clinic or primary care.  You may benefit from seeing a gastroenterologist to call them to schedule an appointment if your symptoms continue.  If anything worsens and you have fever, severe pain, nausea/vomiting, blood in your stool you need to go to the emergency room.     ED Prescriptions     Medication Sig Dispense Auth. Provider   dicyclomine (BENTYL) 20 MG tablet Take 1 tablet (20 mg total) by mouth 2 (two) times daily. 20 tablet Yeiren Whitecotton, Noberto Retort, PA-C      PDMP not reviewed this encounter.   Jeani Hawking, PA-C 06/29/23 2027

## 2023-06-30 ENCOUNTER — Other Ambulatory Visit: Payer: Self-pay

## 2023-07-01 LAB — CERVICOVAGINAL ANCILLARY ONLY
Chlamydia: NEGATIVE
Comment: NEGATIVE
Comment: NEGATIVE
Comment: NORMAL
Neisseria Gonorrhea: NEGATIVE
Trichomonas: NEGATIVE

## 2023-07-01 LAB — HIV ANTIBODY (ROUTINE TESTING W REFLEX): HIV Screen 4th Generation wRfx: NONREACTIVE

## 2023-07-01 LAB — RPR: RPR Ser Ql: NONREACTIVE

## 2023-08-13 NOTE — Progress Notes (Signed)
Subjective:     Patient ID: Courtney Hanson, female    DOB: 01/31/1976, 47 y.o.   MRN: [id number]  Chief Complaint  Patient presents with   Medication Refill    Discuss medications and refills    HPI  Anxiety/Depression - Taking xanax 1-2 times a week, states she has recently been taking more frequently due to stress from life/work. She is also taking Prozac 60 mg. She endorses increased irritability, moody, and a lack of motivation. Has tried counseling and seeing psychiatry, did not feel it helped. She has also tried zoloft (didn't work), Physicist, medical (increased BP). She tried Wellbutrin but can not recall the reason it was discontinued. Has not tried buspar, lexapro, or hydroxyzine. No SI.   GERD - Taking nexium 40 mg as needed, about 2-3 times a month. Tolerating nexium well, no breakthrough heartburn, food getting stuck, or black stools. She notes spicy foods tends to trigger her acid reflux.   Insomnia - Taking ambien 6.25 mg a few nights a month. She states some nights she doesn't sleep well. She is unsure if the nights she doesn't sleep well could be attributed to perimenopause or over-thinking. She reports she is having her menses every 26-30 days.  Migraines - She complains of constant migraines w/o auras, every other day. She endorses sensation of "head pounding" around temples and photosensitivity. Sometimes has to close her eyes to get relief from photosensitivity. She believes her migraines may be attributed to her increased stress. She has not had to leave work due to her migraines, is able to power through the day. She previously would have occasional migraines, but recently they have worsened and become more frequent. No vomiting.   Taking Lasix 20 mg about once a month. Not in need of refill today.   6.  PreDM-"need labs".  Some limited exercise   Health Maintenance Due  Topic Date Due   Hepatitis C Screening  Never done   PAP SMEAR-Modifier  05/14/2022    Past  Medical History:  Diagnosis Date   Anxiety    GERD (gastroesophageal reflux disease)    Lower extremity edema    Pulmonary embolism (HCC)     Past Surgical History:  Procedure Laterality Date   CHOLECYSTECTOMY, LAPAROSCOPIC N/A 2003   COLONOSCOPY WITH PROPOFOL N/A 10/17/2021   Procedure: COLONOSCOPY WITH PROPOFOL;  Surgeon: Toney Reil, MD;  Location: [facility] ENDOSCOPY;  Service: Gastroenterology;  Laterality: N/A;   DILITATION & CURRETTAGE/HYSTROSCOPY WITH ESSURE     ESOPHAGOGASTRODUODENOSCOPY N/A 10/17/2021   Procedure: ESOPHAGOGASTRODUODENOSCOPY (EGD);  Surgeon: Toney Reil, MD;  Location: [facility] ENDOSCOPY;  Service: Gastroenterology;  Laterality: N/A;   LEEP N/A 2003     Current Outpatient Medications:    albuterol (VENTOLIN HFA) 108 (90 Base) MCG/ACT inhaler, SMARTSIG:2 Puff(s) By Mouth Every 6 Hours PRN, Disp: , Rfl:    apixaban (ELIQUIS) 2.5 MG TABS tablet, Take 1 tablet (2.5 mg total) by mouth 2 (two) times daily., Disp: 60 tablet, Rfl: 5   cyclobenzaprine (FLEXERIL) 5 MG tablet, Take by mouth as needed., Disp: , Rfl:    desvenlafaxine (PRISTIQ) 100 MG 24 hr tablet, Take 1 tablet (100 mg total) by mouth daily., Disp: 30 tablet, Rfl: 1   esomeprazole (NEXIUM) 40 MG capsule, Take 1 capsule (40 mg total) by mouth daily before breakfast., Disp: 90 capsule, Rfl: 0   furosemide (LASIX) 20 MG tablet, Take 1 tablet (20 mg total) by mouth as needed., Disp: 30 tablet, Rfl: 3  hydrOXYzine (VISTARIL) 25 MG capsule, Take 1 capsule (25 mg total) by mouth every 8 (eight) hours as needed., Disp: 30 capsule, Rfl: 3   Iron-Vitamin C (VITRON-C) 65-125 MG TABS, Take 1 tablet by mouth daily., Disp: 90 tablet, Rfl: 1   potassium chloride (MICRO-K) 10 MEQ CR capsule, Take 2 capsules (20 mEq total) by mouth as needed., Disp: 30 capsule, Rfl: 3   Ubrogepant (UBRELVY) 50 MG TABS, Take 1 tablet (50 mg total) by mouth daily as needed., Disp: 16 tablet, Rfl: 2   ALPRAZolam (XANAX) 0.5 MG  tablet, Take 1 tablet (0.5 mg total) by mouth 2 (two) times daily as needed., Disp: 30 tablet, Rfl: 1   promethazine (PHENERGAN) 25 MG tablet, Take 1 tablet (25 mg total) by mouth every 6 (six) hours as needed for nausea, Disp: 30 tablet, Rfl: 2   zolpidem (AMBIEN CR) 6.25 MG CR tablet, Take 1 tablet (6.25 mg total) by mouth at bedtime as needed for sleep., Disp: 30 tablet, Rfl: 1  Allergies  Allergen Reactions   Tetanus Toxoid Dermatitis, Hives, Itching and Rash   Topiramate     Shaking hands & whoozy   Venlafaxine     Elevation of blood pressure   Latex     Latex birth Control   ROS neg/noncontributory except as noted HPI/below      Objective:     BP 120/81   Pulse 90   Temp 98.6 F (37 C) (Temporal)   Resp 18   Ht 5\' 1"  (1.549 m)   Wt (!) 332 lb (150.6 kg)   LMP 08/07/2023 (Exact Date)   SpO2 95%   BMI 62.73 kg/m  Wt Readings from Last 3 Encounters:  08/14/23 (!) 332 lb (150.6 kg)  09/17/22 (!) 305 lb (138.3 kg)  09/12/22 (!) 310 lb 3.2 oz (140.7 kg)    Physical Exam   Gen: WDWN NAD HEENT: NCAT, conjunctiva not injected, sclera nonicteric NECK:  supple, no thyromegaly, no nodes, no carotid bruits CARDIAC: RRR, S1S2+, no murmur. DP 2+B LUNGS: CTAB. No wheezes ABDOMEN:  BS+, soft, NTND, No HSM, no masses EXT:  no edema MSK: no gross abnormalities.  NEURO: A&O x3.  CN II-XII intact.  PSYCH: normal mood. Good eye contact  PDMP reviewed today, no red flags.     Assessment & Plan:  Prediabetes Assessment & Plan: Chronic.  Needs to work on diet/exercise.  Check A1c  Orders: -     Hemoglobin A1c -     Lipid panel -     TSH -     Comprehensive metabolic panel; Future  Migraine without aura and without status migrainosus, not intractable Assessment & Plan: Chronic.  Becoming more frequent and severe.  Had gone many years without, but increased stress.  Will try Ubrelvy 50 mg daily as needed.  I am concerned about her using sumatriptan due to her higher  doses of serotonin.  Also, and magnesium 400 mg daily.  Do some neck stretches   Chronic GERD Assessment & Plan: Chronic.  Well-controlled without meds.  Occasionally takes Nexium 40 mg  Orders: -     Magnesium -     Vitamin B12  GAD (generalized anxiety disorder) Assessment & Plan: Chronic.  Not well-controlled on 60 mg of Prozac.  Advised I am not comfortable with increasing dose.  She has not responded well to several others.  Does not want to go back to counseling.  Advised to start exercising more as this will also help.  Will change Prozac to Pristiq 100 mg daily.  Advised probably need to see psychiatry again to manage medications.  Renewed Xanax 0.5 mg twice daily as needed.  Advised not to use frequently.  Will also trial hydroxyzine 25 mg 3 times daily as needed  Orders: -     Ambulatory referral to Psychiatry  Primary insomnia Assessment & Plan: Chronic.  Fair control.  Continue Ambien CR 6.25 mg nightly as needed (she only uses a few times a month).  She can also try hydroxyzine 25 mg at at bedtime if needed.   Major depressive disorder, recurrent episode, moderate (HCC) Assessment & Plan: Chronic.  Not well-controlled.  Change Prozac to Pristiq 100 mg daily.  Referral done for psychiatry for medication management  Orders: -     Ambulatory referral to Psychiatry  Other orders -     ALPRAZolam; Take 1 tablet (0.5 mg total) by mouth 2 (two) times daily as needed.  Dispense: 30 tablet; Refill: 1 -     Promethazine HCl; Take 1 tablet (25 mg total) by mouth every 6 (six) hours as needed for nausea  Dispense: 30 tablet; Refill: 2 -     Zolpidem Tartrate ER; Take 1 tablet (6.25 mg total) by mouth at bedtime as needed for sleep.  Dispense: 30 tablet; Refill: 1 -     hydrOXYzine Pamoate; Take 1 capsule (25 mg total) by mouth every 8 (eight) hours as needed.  Dispense: 30 capsule; Refill: 3 -     Desvenlafaxine Succinate ER; Take 1 tablet (100 mg total) by mouth daily.  Dispense:  30 tablet; Refill: 1 -     Ubrelvy; Take 1 tablet (50 mg total) by mouth daily as needed.  Dispense: 16 tablet; Refill: 2    Return in about 4 weeks (around 09/11/2023) for annual physical-moods.    I,Rachel Rivera,acting as a scribe for Angelena Sole, MD.,have documented all relevant documentation on the behalf of Angelena Sole, MD,as directed by  Angelena Sole, MD while in the presence of Angelena Sole, MD.  I, Angelena Sole, MD, have reviewed all documentation for this visit. The documentation on 08/15/23 for the exam, diagnosis, procedures, and orders are all accurate and complete.   Angelena Sole, MD

## 2023-08-14 ENCOUNTER — Encounter: Payer: Self-pay | Admitting: Family Medicine

## 2023-08-14 ENCOUNTER — Other Ambulatory Visit: Payer: Self-pay

## 2023-08-14 ENCOUNTER — Ambulatory Visit (INDEPENDENT_AMBULATORY_CARE_PROVIDER_SITE_OTHER): Payer: 59 | Admitting: Family Medicine

## 2023-08-14 VITALS — BP 120/81 | HR 90 | Temp 98.6°F | Resp 18 | Ht 61.0 in | Wt 332.0 lb

## 2023-08-14 DIAGNOSIS — R7303 Prediabetes: Secondary | ICD-10-CM | POA: Diagnosis not present

## 2023-08-14 DIAGNOSIS — F331 Major depressive disorder, recurrent, moderate: Secondary | ICD-10-CM | POA: Diagnosis not present

## 2023-08-14 DIAGNOSIS — F5101 Primary insomnia: Secondary | ICD-10-CM

## 2023-08-14 DIAGNOSIS — K219 Gastro-esophageal reflux disease without esophagitis: Secondary | ICD-10-CM

## 2023-08-14 DIAGNOSIS — F411 Generalized anxiety disorder: Secondary | ICD-10-CM | POA: Diagnosis not present

## 2023-08-14 DIAGNOSIS — G43009 Migraine without aura, not intractable, without status migrainosus: Secondary | ICD-10-CM

## 2023-08-14 LAB — COMPREHENSIVE METABOLIC PANEL
ALT: 15 U/L (ref 0–35)
AST: 13 U/L (ref 0–37)
Albumin: 3.7 g/dL (ref 3.5–5.2)
Alkaline Phosphatase: 47 U/L (ref 39–117)
BUN: 12 mg/dL (ref 6–23)
CO2: 31 meq/L (ref 19–32)
Calcium: 9.2 mg/dL (ref 8.4–10.5)
Chloride: 102 meq/L (ref 96–112)
Creatinine, Ser: 0.62 mg/dL (ref 0.40–1.20)
GFR: 104.84 mL/min (ref >60.00)
Glucose, Bld: 89 mg/dL (ref 70–99)
Potassium: 4.2 meq/L (ref 3.5–5.1)
Sodium: 139 meq/L (ref 135–145)
Total Bilirubin: 0.8 mg/dL (ref 0.2–1.2)
Total Protein: 6.9 g/dL (ref 6.0–8.3)

## 2023-08-14 LAB — LIPID PANEL
Cholesterol: 244 mg/dL — ABNORMAL HIGH (ref 0–200)
HDL: 116.5 mg/dL (ref >39.00)
LDL Cholesterol: 114 mg/dL — ABNORMAL HIGH (ref 0–99)
NonHDL: 127.28
Total CHOL/HDL Ratio: 2
Triglycerides: 66 mg/dL (ref 0.0–149.0)
VLDL: 13.2 mg/dL (ref 0.0–40.0)

## 2023-08-14 LAB — TSH: TSH: 0.86 u[IU]/mL (ref 0.35–5.50)

## 2023-08-14 LAB — HEMOGLOBIN A1C: Hgb A1c MFr Bld: 6.1 % (ref 4.6–6.5)

## 2023-08-14 LAB — VITAMIN B12: Vitamin B-12: 196 pg/mL — ABNORMAL LOW (ref 211–911)

## 2023-08-14 LAB — MAGNESIUM: Magnesium: 1.7 mg/dL (ref 1.5–2.5)

## 2023-08-14 MED ORDER — HYDROXYZINE PAMOATE 25 MG PO CAPS
25.0000 mg | ORAL_CAPSULE | Freq: Three times a day (TID) | ORAL | 3 refills | Status: DC | PRN
  Filled 2023-08-14: qty 30, 10d supply, fill #0
  Filled 2024-03-31: qty 30, 10d supply, fill #1

## 2023-08-14 MED ORDER — UBRELVY 50 MG PO TABS
50.0000 mg | ORAL_TABLET | Freq: Every day | ORAL | 2 refills | Status: DC | PRN
Start: 2023-08-14 — End: 2023-09-11
  Filled 2023-08-14: qty 10, 30d supply, fill #0
  Filled 2023-09-04: qty 16, 16d supply, fill #0

## 2023-08-14 MED ORDER — ALPRAZOLAM 0.5 MG PO TABS
0.5000 mg | ORAL_TABLET | Freq: Two times a day (BID) | ORAL | 1 refills | Status: DC | PRN
  Filled 2023-08-14: qty 30, 15d supply, fill #0

## 2023-08-14 MED ORDER — PROMETHAZINE HCL 25 MG PO TABS
25.0000 mg | ORAL_TABLET | Freq: Four times a day (QID) | ORAL | 2 refills | Status: AC | PRN
  Filled 2023-08-14: qty 30, 8d supply, fill #0

## 2023-08-14 MED ORDER — ZOLPIDEM TARTRATE ER 6.25 MG PO TBCR
6.2500 mg | EXTENDED_RELEASE_TABLET | Freq: Every evening | ORAL | 1 refills | Status: AC | PRN
  Filled 2023-08-14: qty 30, 30d supply, fill #0

## 2023-08-14 MED ORDER — DESVENLAFAXINE SUCCINATE ER 100 MG PO TB24
100.0000 mg | ORAL_TABLET | Freq: Every day | ORAL | 1 refills | Status: DC
  Filled 2023-08-14: qty 30, 30d supply, fill #0
  Filled 2023-09-14: qty 30, 30d supply, fill #1

## 2023-08-14 NOTE — Patient Instructions (Addendum)
Migraine-magnesium oxide 400mg  in evening  Ubrelvy for bad headache.  Change the prozac to pristiq  Hydroxyzine for anxiety and or sleep.  Referral to psych

## 2023-08-15 DIAGNOSIS — G43009 Migraine without aura, not intractable, without status migrainosus: Secondary | ICD-10-CM | POA: Insufficient documentation

## 2023-08-15 NOTE — Assessment & Plan Note (Signed)
Chronic.  Fair control.  Continue Ambien CR 6.25 mg nightly as needed (she only uses a few times a month).  She can also try hydroxyzine 25 mg at at bedtime if needed.

## 2023-08-15 NOTE — Assessment & Plan Note (Signed)
Chronic.  Not well-controlled.  Change Prozac to Pristiq 100 mg daily.  Referral done for psychiatry for medication management

## 2023-08-15 NOTE — Assessment & Plan Note (Signed)
Chronic.  Well-controlled without meds.  Occasionally takes Nexium 40 mg

## 2023-08-15 NOTE — Assessment & Plan Note (Signed)
Chronic.  Becoming more frequent and severe.  Had gone many years without, but increased stress.  Will try Ubrelvy 50 mg daily as needed.  I am concerned about her using sumatriptan due to her higher doses of serotonin.  Also, and magnesium 400 mg daily.  Do some neck stretches

## 2023-08-15 NOTE — Assessment & Plan Note (Signed)
Chronic.  Needs to work on diet/exercise.  Check A1c

## 2023-08-15 NOTE — Assessment & Plan Note (Signed)
Chronic.  Not well-controlled on 60 mg of Prozac.  Advised I am not comfortable with increasing dose.  She has not responded well to several others.  Does not want to go back to counseling.  Advised to start exercising more as this will also help.  Will change Prozac to Pristiq 100 mg daily.  Advised probably need to see psychiatry again to manage medications.  Renewed Xanax 0.5 mg twice daily as needed.  Advised not to use frequently.  Will also trial hydroxyzine 25 mg 3 times daily as needed

## 2023-08-16 NOTE — Progress Notes (Signed)
Labs are consistent with prediabetes still.  Continue working on diet/exercise. Of concern, her B12 is very low at 196.  Needs to take B12 vitamins over-the-counter 2000 mcg/day.  Please schedule your 1 month follow-up and we will repeat levels to make sure you are absorbing it.

## 2023-08-17 ENCOUNTER — Other Ambulatory Visit: Payer: Self-pay | Admitting: *Deleted

## 2023-08-17 DIAGNOSIS — E538 Deficiency of other specified B group vitamins: Secondary | ICD-10-CM

## 2023-08-18 ENCOUNTER — Inpatient Hospital Stay: Payer: 59

## 2023-08-18 ENCOUNTER — Inpatient Hospital Stay: Payer: 59 | Admitting: Oncology

## 2023-09-04 ENCOUNTER — Other Ambulatory Visit: Payer: Self-pay

## 2023-09-09 ENCOUNTER — Telehealth: Payer: Self-pay | Admitting: Pharmacy Technician

## 2023-09-09 ENCOUNTER — Other Ambulatory Visit (HOSPITAL_COMMUNITY): Payer: Self-pay

## 2023-09-09 NOTE — Telephone Encounter (Signed)
Pharmacy Patient Advocate Encounter   Received notification from CoverMyMeds that prior authorization for Ubrelvy 50MG  tablets is required/requested.   Insurance verification completed.   The patient is insured through [facility] .   Per test claim: PA required; PA submitted to [facility] via CoverMyMeds Key/confirmation #/EOC [id number] Status is pending

## 2023-09-11 ENCOUNTER — Encounter: Payer: Self-pay | Admitting: Oncology

## 2023-09-11 ENCOUNTER — Other Ambulatory Visit: Payer: Self-pay

## 2023-09-11 ENCOUNTER — Inpatient Hospital Stay: Payer: 59 | Attending: Oncology

## 2023-09-11 ENCOUNTER — Inpatient Hospital Stay (HOSPITAL_BASED_OUTPATIENT_CLINIC_OR_DEPARTMENT_OTHER): Payer: 59 | Admitting: Oncology

## 2023-09-11 VITALS — BP 124/83 | HR 88 | Temp 97.0°F | Resp 18

## 2023-09-11 DIAGNOSIS — Z86711 Personal history of pulmonary embolism: Secondary | ICD-10-CM | POA: Diagnosis not present

## 2023-09-11 DIAGNOSIS — D508 Other iron deficiency anemias: Secondary | ICD-10-CM

## 2023-09-11 DIAGNOSIS — D509 Iron deficiency anemia, unspecified: Secondary | ICD-10-CM | POA: Diagnosis not present

## 2023-09-11 DIAGNOSIS — Z86718 Personal history of other venous thrombosis and embolism: Secondary | ICD-10-CM | POA: Diagnosis not present

## 2023-09-11 DIAGNOSIS — Z7901 Long term (current) use of anticoagulants: Secondary | ICD-10-CM | POA: Diagnosis not present

## 2023-09-11 LAB — CMP (CANCER CENTER ONLY)
ALT: 20 U/L (ref 0–44)
AST: 18 U/L (ref 15–41)
Albumin: 3.7 g/dL (ref 3.5–5.0)
Alkaline Phosphatase: 50 U/L (ref 38–126)
Anion gap: 7 (ref 5–15)
BUN: 7 mg/dL (ref 6–20)
CO2: 26 mmol/L (ref 22–32)
Calcium: 8.9 mg/dL (ref 8.9–10.3)
Chloride: 105 mmol/L (ref 98–111)
Creatinine: 0.65 mg/dL (ref 0.44–1.00)
GFR, Estimated: >60 mL/min (ref >60)
Glucose, Bld: 104 mg/dL — ABNORMAL HIGH (ref 70–99)
Potassium: 4.2 mmol/L (ref 3.5–5.1)
Sodium: 138 mmol/L (ref 135–145)
Total Bilirubin: 0.9 mg/dL (ref 0.3–1.2)
Total Protein: 7.5 g/dL (ref 6.5–8.1)

## 2023-09-11 LAB — CBC WITH DIFFERENTIAL (CANCER CENTER ONLY)
Abs Immature Granulocytes: 0.01 10*3/uL (ref 0.00–0.07)
Basophils Absolute: 0 10*3/uL (ref 0.0–0.1)
Basophils Relative: 1 %
Eosinophils Absolute: 0.2 10*3/uL (ref 0.0–0.5)
Eosinophils Relative: 3 %
HCT: 35.3 % — ABNORMAL LOW (ref 36.0–46.0)
Hemoglobin: 11 g/dL — ABNORMAL LOW (ref 12.0–15.0)
Immature Granulocytes: 0 %
Lymphocytes Relative: 47 %
Lymphs Abs: 3 10*3/uL (ref 0.7–4.0)
MCH: 26.6 pg (ref 26.0–34.0)
MCHC: 31.2 g/dL (ref 30.0–36.0)
MCV: 85.5 fL (ref 80.0–100.0)
Monocytes Absolute: 0.6 10*3/uL (ref 0.1–1.0)
Monocytes Relative: 9 %
Neutro Abs: 2.5 10*3/uL (ref 1.7–7.7)
Neutrophils Relative %: 40 %
Platelet Count: 302 10*3/uL (ref 150–400)
RBC: 4.13 MIL/uL (ref 3.87–5.11)
RDW: 16.9 % — ABNORMAL HIGH (ref 11.5–15.5)
WBC Count: 6.3 10*3/uL (ref 4.0–10.5)
nRBC: 0 % (ref 0.0–0.2)

## 2023-09-11 LAB — FERRITIN: Ferritin: 8 ng/mL — ABNORMAL LOW (ref 11–307)

## 2023-09-11 LAB — IRON AND TIBC
Iron: 42 ug/dL (ref 28–170)
Saturation Ratios: 9 % — ABNORMAL LOW (ref 10.4–31.8)
TIBC: 472 ug/dL — ABNORMAL HIGH (ref 250–450)
UIBC: 430 ug/dL

## 2023-09-11 MED ORDER — VITRON-C 65-125 MG PO TABS
1.0000 | ORAL_TABLET | Freq: Every day | ORAL | 1 refills | Status: DC
  Filled 2023-09-11: qty 60, 60d supply, fill #0

## 2023-09-11 MED ORDER — APIXABAN 2.5 MG PO TABS
2.5000 mg | ORAL_TABLET | Freq: Two times a day (BID) | ORAL | 5 refills | Status: DC
  Filled 2023-09-11 – 2023-10-05 (×2): qty 60, 30d supply, fill #0
  Filled 2023-11-04: qty 60, 30d supply, fill #1
  Filled 2023-12-04: qty 60, 30d supply, fill #2
  Filled 2024-01-20: qty 60, 30d supply, fill #3

## 2023-09-11 NOTE — Assessment & Plan Note (Addendum)
Recommend Vitron C daily, she is currently not taking on a daily basis. Lab Results  Component Value Date   HGB 11.0 (L) 09/11/2023   TIBC 472 (H) 09/11/2023   IRONPCTSAT 9 (L) 09/11/2023   FERRITIN 8 (L) 09/11/2023    We discussed the option of IV Venofer treatments.  She prefers to try oral iron supplementation  Hemoglobin has slightly improved.  Persistent iron deficient. Recommend patient to continue vitamin C 1 tablet daily.

## 2023-09-11 NOTE — Assessment & Plan Note (Signed)
 Continue Eliquis 2.5 mg twice daily.

## 2023-09-11 NOTE — Progress Notes (Signed)
Hematology/Oncology Progress note [phone] [phone]      CHIEF COMPLAINTS/REASON FOR VISIT:  pulmonary embolism and DVT, iron deficiency anemia.   ASSESSMENT & PLAN:   IDA (iron deficiency anemia) Recommend Vitron C daily, she is currently not taking on a daily basis. We discussed the option of IV Venofer treatments.  She prefers to try oral iron supplementation first and repeat levels in 4 months  History of DVT (deep vein thrombosis) Continue Eliquis 2.5 mg twice daily.  Orders Placed This Encounter  Procedures   CBC with Differential (Cancer Center Only)    Standing Status:   Future    Standing Expiration Date:   09/10/2024   Iron and TIBC    Standing Status:   Future    Standing Expiration Date:   09/10/2024   Ferritin    Standing Status:   Future    Standing Expiration Date:   09/10/2024   Follow up in 4 months.  All questions were answered. The patient knows to call the clinic with any problems, questions or concerns.  Courtney Patience, MD, PhD [facility] Hematology Oncology 09/11/2023   HISTORY OF PRESENTING ILLNESS:   Courtney Hanson is a  47 y.o.  female with PMH listed below was seen in consultation at the request of  Courtney Sow, MD  for evaluation of history of pulmonary embolism and DVT.  Patient has remote history of 2 episodes of lower extremity DVT. Per patient, first episode was in 2003, she developed acute left lower extremity calf vein DVT after gallbladder surgery.  She was treated with 3 months of anticoagulation  04/09/2013, 05/31/2013 bilateral lower extremity venous duplex showed no right extremity DVT.  Abnormalities consistent with the sequela of the prior venous obstructive process with findings that appear chronic/longstanding in nature and are not identified in the proximal vein.  05/20/2016, second episode of acute left lower extremity DVT, provoked by birth control pills.  Patient was treated with Eliquis for a period of  time.  05/04/2020.She developed shortness of breath and chest pain.  Her work-up showed unprovoked pulmonary embolism on  Patient was started on anticoagulation and has been on Eliquis since then.  Currently she is on Eliquis 5 mg twice daily.  She tolerates well.  Denies any active bleeding events.  Patient has had hypercoagulable work-up done in the past.  Negative factor V Leiden mutation, prothrombin gene mutation. She had  adequate Antithrombin 3 level, protein C and S activity. She was seen by hematology at [facility] on 10/08/2020.  Antiphospholipid panel was obtained by hematology was negative for lupus anticoagulant, negative anticardiolipin IgM/IgG, negative beta 2 glycoprotein protein IgG/IgM  Patient has chronic varicose veins on her bilateral lower extremity. Patient denies any shortness of breath.  She has experienced left anterior chest wall pain which she attributes to " pulled muscle".  Patient has family history of breast cancer in sister.  INTERVAL HISTORY Courtney Hanson is a 47 y.o. female who has above history reviewed by me today presents for follow up visit for history of DVT/PE Patient takes eliquis 2.5mg  BID, tolerates well. She denies any bleeding events, chest pain, SOB, leg edema more than her baseline level.  Patient currently takes vitamin C 1 tablet daily.  Review of Systems  Constitutional:  Negative for appetite change, chills, fatigue and fever.  HENT:   Negative for hearing loss and voice change.   Eyes:  Negative for eye problems.  Respiratory:  Negative for chest tightness and  cough.   Cardiovascular:  Negative for chest pain.  Gastrointestinal:  Negative for abdominal distention, abdominal pain and blood in stool.  Endocrine: Negative for hot flashes.  Genitourinary:  Negative for difficulty urinating and frequency.   Musculoskeletal:  Negative for arthralgias.  Skin:  Negative for itching and rash.  Neurological:  Negative for extremity weakness.   Hematological:  Negative for adenopathy.  Psychiatric/Behavioral:  Negative for confusion.     MEDICAL HISTORY:  Past Medical History:  Diagnosis Date   Anxiety    GERD (gastroesophageal reflux disease)    Lower extremity edema    Pulmonary embolism (HCC)     SURGICAL HISTORY: Past Surgical History:  Procedure Laterality Date   CHOLECYSTECTOMY, LAPAROSCOPIC N/A 2003   COLONOSCOPY WITH PROPOFOL N/A 10/17/2021   Procedure: COLONOSCOPY WITH PROPOFOL;  Surgeon: Courtney Reil, MD;  Location: [facility] ENDOSCOPY;  Service: Gastroenterology;  Laterality: N/A;   DILITATION & CURRETTAGE/HYSTROSCOPY WITH ESSURE     ESOPHAGOGASTRODUODENOSCOPY N/A 10/17/2021   Procedure: ESOPHAGOGASTRODUODENOSCOPY (EGD);  Surgeon: Courtney Reil, MD;  Location: [facility] ENDOSCOPY;  Service: Gastroenterology;  Laterality: N/A;   LEEP N/A 2003    SOCIAL HISTORY: Social History   Socioeconomic History   Marital status: Single    Spouse name: Not on file   Number of children: 4   Years of education: Not on file   Highest education level: Not on file  Occupational History   Not on file  Tobacco Use   Smoking status: Never   Smokeless tobacco: Never  Vaping Use   Vaping status: Never Used  Substance and Sexual Activity   Alcohol use: Not Currently   Drug use: Never   Sexual activity: Yes    Birth control/protection: Surgical    Comment: essure  Other Topics Concern   Not on file  Social History Narrative   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 [employer]   Social Determinants of Health   Financial Resource Strain: Not on file  Food Insecurity: Not on file  Transportation Needs: Not on file  Physical Activity: Not on file  Stress: Not on file  Social Connections: Unknown (04/20/2022)   [facility], [facility]   Social Network    Social Network: Not on file  Intimate Partner Violence: Unknown (03/12/2022)   Received from [employer], [facility]    HITS    Physically Hurt: Not on file    Insult or Talk Down To: Not on file    Threaten Physical Harm: Not on file    Scream or Curse: Not on file    FAMILY HISTORY: Family History  Problem Relation Age of Onset   Hyperlipidemia Mother    Arthritis Mother    Hypertension Mother    Diabetes Mother    Hyperlipidemia Father    Alcohol abuse Father    Arthritis Father    Hypertension Father    Breast cancer Sister 32   Breast cancer Paternal Aunt    Stomach cancer Paternal Uncle    Cancer Paternal Uncle        unk type   Alcohol abuse Maternal Grandfather    Diabetes Paternal Grandmother    Throat cancer Paternal Grandfather        dx. 33s   Birth defects Paternal Grandfather    Alcohol abuse Paternal Grandfather     ALLERGIES:  is allergic to tetanus toxoid, topiramate, venlafaxine, and latex.  MEDICATIONS:  Current Outpatient Medications  Medication Sig Dispense Refill  albuterol (VENTOLIN HFA) 108 (90 Base) MCG/ACT inhaler SMARTSIG:2 Puff(s) By Mouth Every 6 Hours PRN     ALPRAZolam (XANAX) 0.5 MG tablet Take 1 tablet (0.5 mg total) by mouth 2 (two) times daily as needed. 30 tablet 1   cyclobenzaprine (FLEXERIL) 5 MG tablet Take by mouth as needed.     desvenlafaxine (PRISTIQ) 100 MG 24 hr tablet Take 1 tablet (100 mg total) by mouth daily. 30 tablet 1   esomeprazole (NEXIUM) 40 MG capsule Take 1 capsule (40 mg total) by mouth daily before breakfast. 90 capsule 0   furosemide (LASIX) 20 MG tablet Take 1 tablet (20 mg total) by mouth as needed. 30 tablet 3   hydrOXYzine (VISTARIL) 25 MG capsule Take 1 capsule (25 mg total) by mouth every 8 (eight) hours as needed. 30 capsule 3   potassium chloride (MICRO-K) 10 MEQ CR capsule Take 2 capsules (20 mEq total) by mouth as needed. 30 capsule 3   promethazine (PHENERGAN) 25 MG tablet Take 1 tablet (25 mg total) by mouth every 6 (six) hours as needed for nausea 30 tablet 2   zolpidem (AMBIEN CR) 6.25 MG CR tablet Take 1 tablet  (6.25 mg total) by mouth at bedtime as needed for sleep. 30 tablet 1   apixaban (ELIQUIS) 2.5 MG TABS tablet Take 1 tablet (2.5 mg total) by mouth 2 (two) times daily. 60 tablet 5   Iron-Vitamin C (VITRON-C) 65-125 MG TABS Take 1 tablet by mouth daily. 90 tablet 1   No current facility-administered medications for this visit.     PHYSICAL EXAMINATION: ECOG PERFORMANCE STATUS: 0 - Asymptomatic Vitals per epic record  Physical Exam Constitutional:      General: She is not in acute distress.    Appearance: She is obese.  HENT:     Head: Normocephalic and atraumatic.  Eyes:     General: No scleral icterus. Cardiovascular:     Rate and Rhythm: Normal rate and regular rhythm.     Heart sounds: Normal heart sounds.  Pulmonary:     Effort: Pulmonary effort is normal. No respiratory distress.     Breath sounds: No wheezing.  Abdominal:     General: Bowel sounds are normal. There is no distension.     Palpations: Abdomen is soft.  Musculoskeletal:        General: No deformity. Normal range of motion.     Cervical back: Normal range of motion and neck supple.     Comments: Right lower extremity varicose vein  Skin:    General: Skin is warm and dry.     Findings: No erythema or rash.  Neurological:     Mental Status: She is alert and oriented to person, place, and time. Mental status is at baseline.     Cranial Nerves: No cranial nerve deficit.     Coordination: Coordination normal.  Psychiatric:        Mood and Affect: Mood normal.     LABORATORY DATA:  I have reviewed the data as listed Lab Results  Component Value Date   WBC 6.3 09/11/2023   HGB 11.0 (L) 09/11/2023   HCT 35.3 (L) 09/11/2023   MCV 85.5 09/11/2023   PLT 302 09/11/2023   Recent Labs    09/12/22 1153 04/17/23 1205 08/14/23 1208 09/11/23 1039  NA 138 136 139 138  K 4.0 3.8 4.2 4.2  CL 109 102 102 105  CO2 28 23 31 26   GLUCOSE 108* 109* 89 104*  BUN 11  13 12 7   CREATININE 0.65 0.74 0.62 0.65   CALCIUM 9.0 8.8* 9.2 8.9  GFRNONAA >60 >60  --  >60  PROT 7.7 7.8 6.9 7.5  ALBUMIN 3.7 3.7 3.7 3.7  AST 16 22 13 18   ALT 12 16 15 20   ALKPHOS 50 45 47 50  BILITOT 0.7 1.1 0.8 0.9   Iron/TIBC/Ferritin/ %Sat    Component Value Date/Time   IRON 42 09/11/2023 1039   TIBC 472 (H) 09/11/2023 1039   FERRITIN 8 (L) 09/11/2023 1039   IRONPCTSAT 9 (L) 09/11/2023 1039      RADIOGRAPHIC STUDIES: I have personally reviewed the radiological images as listed and agreed with the findings in the report. No results found.

## 2023-09-11 NOTE — Progress Notes (Signed)
Pt here for follow up.Reports having a lot of abdominal pain

## 2023-09-14 ENCOUNTER — Other Ambulatory Visit: Payer: Self-pay | Admitting: Oncology

## 2023-09-14 ENCOUNTER — Telehealth: Payer: Self-pay

## 2023-09-14 ENCOUNTER — Other Ambulatory Visit: Payer: Self-pay

## 2023-09-14 DIAGNOSIS — D508 Other iron deficiency anemias: Secondary | ICD-10-CM

## 2023-09-14 NOTE — Telephone Encounter (Signed)
-----   Message from [name] sent at 09/11/2023  7:57 PM EDT ----- Please let patient know that her iron level is persistently low. Recommend patient to consider IV Venofer treatments.  If she declines, recommend patient to continue take Vitron-C 1 tablet daily.  Please ask details about her abdominal pain.  I recommend patient to see gastroenterology for further evaluation of iron deficiency anemia

## 2023-09-14 NOTE — Telephone Encounter (Signed)
Spoke to Courtney Hanson and informed her of MD recommendation. Courtney Hanson agreed to proceed with IV venofer. Referral to GI place.   Dr. Cathie Hoops please advise on how my Iron infuisions Courtney Hanson will need. Courtney Hanson described abdominal pain as occcasional aching to left lower quadrant.

## 2023-09-14 NOTE — Telephone Encounter (Signed)
Please schedule IV venofer weekly x4 (first dose in NEW). Discussed this with pt. Please inform her of appt details.   Please also add venofer to appt on 2/7, when she see's MD.

## 2023-09-15 ENCOUNTER — Encounter: Payer: Self-pay | Admitting: *Deleted

## 2023-09-15 ENCOUNTER — Encounter: Payer: Self-pay | Admitting: Oncology

## 2023-09-15 NOTE — Telephone Encounter (Signed)
Pharmacy Patient Advocate Encounter  Received notification from [facility] that Prior Authorization for Ubrelvy 50MG  tablets has been APPROVED from 09-11-2023 to 03-09-2024   PA #/Case ID/Reference #: [id number]

## 2023-09-15 NOTE — Telephone Encounter (Signed)
Patient notified of message below.

## 2023-09-30 ENCOUNTER — Other Ambulatory Visit: Payer: Self-pay | Admitting: Family Medicine

## 2023-09-30 ENCOUNTER — Other Ambulatory Visit: Payer: Self-pay

## 2023-09-30 MED ORDER — UBRELVY 50 MG PO TABS
50.0000 mg | ORAL_TABLET | Freq: Every day | ORAL | 2 refills | Status: DC | PRN
  Filled 2023-09-30: qty 16, 30d supply, fill #0

## 2023-10-05 ENCOUNTER — Other Ambulatory Visit: Payer: Self-pay

## 2023-10-05 ENCOUNTER — Other Ambulatory Visit: Payer: Self-pay | Admitting: Family Medicine

## 2023-10-05 MED FILL — Desvenlafaxine Succinate Tab ER 24HR 100 MG (Base Equiv): ORAL | 30 days supply | Qty: 30 | Fill #0 | Status: CN

## 2023-10-06 ENCOUNTER — Other Ambulatory Visit: Payer: Self-pay

## 2023-10-07 ENCOUNTER — Encounter: Payer: Self-pay | Admitting: Family Medicine

## 2023-10-08 ENCOUNTER — Other Ambulatory Visit: Payer: Self-pay

## 2023-10-08 MED FILL — Desvenlafaxine Succinate Tab ER 24HR 100 MG (Base Equiv): ORAL | 30 days supply | Qty: 30 | Fill #0 | Status: AC

## 2023-10-09 ENCOUNTER — Other Ambulatory Visit: Payer: Self-pay

## 2023-10-13 NOTE — Telephone Encounter (Signed)
Patient father aware form ready to be pick up

## 2023-10-23 ENCOUNTER — Other Ambulatory Visit: Payer: 59

## 2023-10-23 ENCOUNTER — Ambulatory Visit: Payer: 59 | Admitting: Oncology

## 2023-10-27 MED FILL — Desvenlafaxine Succinate Tab ER 24HR 100 MG (Base Equiv): ORAL | 30 days supply | Qty: 30 | Fill #1 | Status: CN

## 2023-10-28 ENCOUNTER — Encounter: Payer: Self-pay | Admitting: Oncology

## 2023-10-28 ENCOUNTER — Other Ambulatory Visit: Payer: Self-pay

## 2023-11-04 ENCOUNTER — Encounter: Payer: Self-pay | Admitting: Family Medicine

## 2023-11-04 ENCOUNTER — Other Ambulatory Visit: Payer: Self-pay

## 2023-11-04 ENCOUNTER — Ambulatory Visit: Payer: 59 | Admitting: Family Medicine

## 2023-11-04 VITALS — BP 113/80 | HR 98 | Temp 98.3°F | Resp 18 | Ht 61.0 in

## 2023-11-04 DIAGNOSIS — F411 Generalized anxiety disorder: Secondary | ICD-10-CM | POA: Diagnosis not present

## 2023-11-04 DIAGNOSIS — G5603 Carpal tunnel syndrome, bilateral upper limbs: Secondary | ICD-10-CM

## 2023-11-04 DIAGNOSIS — F331 Major depressive disorder, recurrent, moderate: Secondary | ICD-10-CM | POA: Diagnosis not present

## 2023-11-04 MED ORDER — BUPROPION HCL ER (XL) 150 MG PO TB24
150.0000 mg | ORAL_TABLET | Freq: Every day | ORAL | 1 refills | Status: DC
  Filled 2023-11-04: qty 30, 30d supply, fill #0
  Filled 2023-12-04: qty 30, 30d supply, fill #1

## 2023-11-04 MED FILL — Desvenlafaxine Succinate Tab ER 24HR 100 MG (Base Equiv): ORAL | 30 days supply | Qty: 30 | Fill #1 | Status: AC

## 2023-11-04 NOTE — Patient Instructions (Addendum)
It was very nice to see you today!  Happy Holidays Wear wrist splints  F/u on psych Add wellbutrin in am.  [facility]  [address] on the 1st floor Phone number [phone]   B complex vitamins   PLEASE NOTE:  If you had any lab tests please let us know if you have not heard back within a few days. You may see your results on MyChart before we have a chance to review them but we will give you a call once they are reviewed by [place]. If we ordered any referrals today, please let us know if you have not heard from their office within the next week.   Please try these tips to maintain a healthy lifestyle:  Eat most of your calories during the day when you are active. Eliminate processed foods including packaged sweets (pies, cakes, cookies), reduce intake of potatoes, white bread, white pasta, and white rice. Look for whole grain options, oat flour or almond flour.  Each meal should contain half fruits/vegetables, one quarter protein, and one quarter carbs (no bigger than a computer mouse).  Cut down on sweet beverages. This includes juice, soda, and sweet tea. Also watch fruit intake, though this is a healthier sweet option, it still contains natural sugar! Limit to 3 servings daily.  Drink at least 1 glass of water with each meal and aim for at least 8 glasses per day  Exercise at least 150 minutes every week.

## 2023-11-04 NOTE — Assessment & Plan Note (Signed)
Chronic.  Not well-controlled.  Continue Pristiq 100 mg daily.  Referral done for psychiatry for medication management but pt hasn't heard from them and not f/u.  Will check on referral.  Will add wellbutrin XL 150mg  in am.  Let us know if SE, o/w f/u 1 mo.  Consider vraylar.  Needs psych

## 2023-11-04 NOTE — Assessment & Plan Note (Signed)
Chronic.  Not well-controlled   She has not responded well to several others.  Does not want to go back to counseling.  No change w/ Pristiq 100 mg daily.  Anxiety not too bad, now more the depression

## 2023-11-04 NOTE — Progress Notes (Signed)
Subjective:    Patient ID: Courtney Hanson, female    DOB: 08/01/1976, 47 y.o.   MRN: [id number]  Chief Complaint  Patient presents with   Medication Consultation    Discuss medication options for depression and carpal tunnel    HPI Anxiety/ Depression - Taking xanax 0.5 mg 1-2 times a week, hydroxyzine 25 mg 1-2 times a week, and Ambien 6.25 mg PRN. Last visit changed Prozac 60 mg to Pristiq 100 mg daily. Ambulatory referral to Psychiatry on 9/06. Did not hear back from them will reach out. She endorses increased irritability, unhappiness, and a lack of motivation. Has tried counseling and seeing psychiatry, did not feel it helped. She has also tried zoloft (didn't work), Physicist, medical (increased BP). She tried Wellbutrin but can not recall the reason it was discontinued. States her moods are more depression than anxiety. No SI. Tearful. Reports she is increasingly forgetful and overwhelmed.   Weight Gain - Reports she is over eating. Feels it is to fill an emotional void. BMI 62.73 last visit . Did not want to know her weight today.   Carpal Tunnel - Pt complains of worsening numbness in both the left and right wrist. States it does not matter if her hands are in a certain position, the numbness persists. Does not wear her splints.   Health Maintenance Due  Topic Date Due   Hepatitis C Screening  Never done   Cervical Cancer Screening (Pap smear)  05/14/2022    Past Medical History:  Diagnosis Date   Anxiety    GERD (gastroesophageal reflux disease)    Lower extremity edema    Pulmonary embolism (HCC)     Past Surgical History:  Procedure Laterality Date   CHOLECYSTECTOMY, LAPAROSCOPIC N/A 2003   COLONOSCOPY WITH PROPOFOL N/A 10/17/2021   Procedure: COLONOSCOPY WITH PROPOFOL;  Surgeon: Toney Reil, MD;  Location: [facility] ENDOSCOPY;  Service: Gastroenterology;  Laterality: N/A;   DILITATION & CURRETTAGE/HYSTROSCOPY WITH ESSURE     ESOPHAGOGASTRODUODENOSCOPY N/A 10/17/2021    Procedure: ESOPHAGOGASTRODUODENOSCOPY (EGD);  Surgeon: Toney Reil, MD;  Location: [facility] ENDOSCOPY;  Service: Gastroenterology;  Laterality: N/A;   LEEP N/A 2003    Current Outpatient Medications:    albuterol (VENTOLIN HFA) 108 (90 Base) MCG/ACT inhaler, SMARTSIG:2 Puff(s) By Mouth Every 6 Hours PRN, Disp: , Rfl:    ALPRAZolam (XANAX) 0.5 MG tablet, Take 1 tablet (0.5 mg total) by mouth 2 (two) times daily as needed., Disp: 30 tablet, Rfl: 1   apixaban (ELIQUIS) 2.5 MG TABS tablet, Take 1 tablet (2.5 mg total) by mouth 2 (two) times daily., Disp: 60 tablet, Rfl: 5   buPROPion (WELLBUTRIN XL) 150 MG 24 hr tablet, Take 1 tablet (150 mg total) by mouth daily., Disp: 30 tablet, Rfl: 1   cyclobenzaprine (FLEXERIL) 5 MG tablet, Take by mouth as needed., Disp: , Rfl:    desvenlafaxine (PRISTIQ) 100 MG 24 hr tablet, Take 1 tablet (100 mg total) by mouth daily., Disp: 30 tablet, Rfl: 1   esomeprazole (NEXIUM) 40 MG capsule, Take 1 capsule (40 mg total) by mouth daily before breakfast., Disp: 90 capsule, Rfl: 0   furosemide (LASIX) 20 MG tablet, Take 1 tablet (20 mg total) by mouth as needed., Disp: 30 tablet, Rfl: 3   hydrOXYzine (VISTARIL) 25 MG capsule, Take 1 capsule (25 mg total) by mouth every 8 (eight) hours as needed., Disp: 30 capsule, Rfl: 3   Iron-Vitamin C (VITRON-C) 65-125 MG TABS, Take 1 tablet by mouth daily.,  Disp: 90 tablet, Rfl: 1   potassium chloride (MICRO-K) 10 MEQ CR capsule, Take 2 capsules (20 mEq total) by mouth as needed., Disp: 30 capsule, Rfl: 3   promethazine (PHENERGAN) 25 MG tablet, Take 1 tablet (25 mg total) by mouth every 6 (six) hours as needed for nausea, Disp: 30 tablet, Rfl: 2   Ubrogepant (UBRELVY) 50 MG TABS, Take 1 tablet (50 mg total) by mouth daily as needed., Disp: 16 tablet, Rfl: 2   zolpidem (AMBIEN CR) 6.25 MG CR tablet, Take 1 tablet (6.25 mg total) by mouth at bedtime as needed for sleep., Disp: 30 tablet, Rfl: 1  Allergies  Allergen Reactions    Tetanus Toxoid Dermatitis, Hives, Itching and Rash   Topiramate     Shaking hands & whoozy   Venlafaxine     Elevation of blood pressure   Latex     Latex birth Control   ROS neg/noncontributory except as noted HPI/below    Objective:     BP 113/80   Pulse 98   Temp 98.3 F (36.8 C) (Temporal)   Resp 18   Ht 5\' 1"  (1.549 m)   SpO2 96%   BMI 62.73 kg/m  Wt Readings from Last 3 Encounters:  08/14/23 (!) 332 lb (150.6 kg)  09/17/22 (!) 305 lb (138.3 kg)  09/12/22 (!) 310 lb 3.2 oz (140.7 kg)    Physical Exam   Gen: WDWN NAD HEENT: NCAT, conjunctiva not injected, sclera nonicteric CARDIAC: RRR, S1S2+, no murmur. LUNGS: CTAB. No wheezes EXT:  no edema MSK: no gross abnormalities.  NEURO: A&O x3.  CN II-XII intact.  PSYCH: normal mood. Good eye contact. tearful  Tinel's + on the left wrist not right. No TTP, no swelling    Assessment & Plan:  GAD (generalized anxiety disorder) Assessment & Plan: Chronic.  Not well-controlled   She has not responded well to several others.  Does not want to go back to counseling.  No change w/ Pristiq 100 mg daily.  Anxiety not too bad, now more the depression   Major depressive disorder, recurrent episode, moderate (HCC) Assessment & Plan: Chronic.  Not well-controlled.  Continue Pristiq 100 mg daily.  Referral done for psychiatry for medication management but pt hasn't heard from them and not f/u.  Will check on referral.  Will add wellbutrin XL 150mg  in am.  Let us know if SE, o/w f/u 1 mo.  Consider vraylar.  Needs psych   Bilateral carpal tunnel syndrome  Other orders -     buPROPion HCl ER (XL); Take 1 tablet (150 mg total) by mouth daily.  Dispense: 30 tablet; Refill: 1  CTS-contact info given for sports med.  Wear the splints Return in about 4 weeks (around 12/02/2023) for mood.  Germaine Pomfret Rice,acting as a scribe for Angelena Sole, MD.,have documented all relevant documentation on the behalf of Angelena Sole, MD,as directed  by  Angelena Sole, MD while in the presence of Angelena Sole, MD.  I, Angelena Sole, MD, have reviewed all documentation for this visit. The documentation on 11/04/23 for the exam, diagnosis, procedures, and orders are all accurate and complete.   Angelena Sole, MD

## 2023-11-09 ENCOUNTER — Encounter: Payer: Self-pay | Admitting: Gastroenterology

## 2023-11-09 ENCOUNTER — Encounter: Payer: Self-pay | Admitting: Family Medicine

## 2023-11-10 ENCOUNTER — Ambulatory Visit
Admission: EM | Admit: 2023-11-10 | Discharge: 2023-11-10 | Disposition: A | Discharge status: Home / Self Care | Payer: 59 | Attending: Emergency Medicine | Admitting: Emergency Medicine

## 2023-11-10 DIAGNOSIS — N760 Acute vaginitis: Secondary | ICD-10-CM | POA: Insufficient documentation

## 2023-11-10 DIAGNOSIS — Z113 Encounter for screening for infections with a predominantly sexual mode of transmission: Secondary | ICD-10-CM | POA: Insufficient documentation

## 2023-11-10 DIAGNOSIS — B9689 Other specified bacterial agents as the cause of diseases classified elsewhere: Secondary | ICD-10-CM | POA: Diagnosis not present

## 2023-11-10 NOTE — Discharge Instructions (Signed)
Labs pending 2-3 days, you will be contacted if positive for any sti and treatment will be sent to the pharmacy, you will have to return to the clinic if positive for gonorrhea to receive treatment   Please refrain from having sex until labs results, if positive please refrain from having sex until treatment complete and symptoms resolve   If positive for , Chlamydia  gonorrhea or trichomoniasis please notify partner or partners so they may tested as well  Moving forward, it is recommended you use some form of protection against the transmission of sti infections  such as condoms or dental dams with each sexual encounter   

## 2023-11-10 NOTE — ED Provider Notes (Signed)
Renaldo Fiddler    CSN: [id number] Arrival date & time: 11/10/23  1634      History   Chief Complaint Chief Complaint  Patient presents with   SEXUALLY TRANSMITTED DISEASE    HPI Courtney Hanson is a 47 y.o. female.   Patient presents requesting STI testing.  Denies symptoms.  No known exposure.  Last menstrual period 1 week ago.  Past Medical History:  Diagnosis Date   Anxiety    GERD (gastroesophageal reflux disease)    Lower extremity edema    Pulmonary embolism [facility])     Patient Active Problem List   Diagnosis Date Noted   Migraine without aura and without status migrainosus, not intractable 08/15/2023   History of DVT (deep vein thrombosis) 09/13/2022   IDA (iron deficiency anemia) 09/13/2022   Genetic testing 03/18/2022   History of pulmonary embolism 02/25/2022   Family history of breast cancer 02/25/2022   History of DVT of lower extremity 02/25/2022   Pulmonary embolism (HCC) 02/11/2022   Primary insomnia 02/11/2022   Localized edema 02/11/2022   Chronic diarrhea    Adenomatous rectal polyp    Nausea without vomiting    Chronic GERD    Erosive gastritis    Prediabetes 10/18/2020   Chronic right-sided thoracic back pain 09/23/2019   Vitamin D deficiency 06/04/2017   Tachycardia 06/19/2016   Major depressive disorder, recurrent episode, moderate (HCC) 07/13/2015   PMDD (premenstrual dysphoric disorder) 12/21/2013   GAD (generalized anxiety disorder) 06/24/2013   Morbid obesity with BMI of 60.0-69.9, adult (HCC) 06/24/2013   Endometritis 05/31/2013    Past Surgical History:  Procedure Laterality Date   CHOLECYSTECTOMY, LAPAROSCOPIC N/A 2003   COLONOSCOPY WITH PROPOFOL N/A 10/17/2021   Procedure: COLONOSCOPY WITH PROPOFOL;  Surgeon: Toney Reil, MD;  Location[facility] ENDOSCOPY;  Service: Gastroenterology;  Laterality: N/A;   DILITATION & CURRETTAGE/HYSTROSCOPY WITH ESSURE     ESOPHAGOGASTRODUODENOSCOPY N/A 10/17/2021   Procedure:  ESOPHAGOGASTRODUODENOSCOPY (EGD);  Surgeon: Toney Reil, MD;  Location: [facility] ENDOSCOPY;  Service: Gastroenterology;  Laterality: N/A;   LEEP N/A 2003    OB History   No obstetric history on file.      Home Medications    Prior to Admission medications   Medication Sig Start Date End Date Taking? Authorizing Provider  albuterol (VENTOLIN HFA) 108 (90 Base) MCG/ACT inhaler SMARTSIG:2 Puff(s) By Mouth Every 6 Hours PRN 11/18/21   [provider]  ALPRAZolam Prudy Feeler) 0.5 MG tablet Take 1 tablet (0.5 mg total) by mouth 2 (two) times daily as needed. 08/14/23   Jeani Sow, MD  apixaban (ELIQUIS) 2.5 MG TABS tablet Take 1 tablet (2.5 mg total) by mouth 2 (two) times daily. 09/11/23   [name], MD  buPROPion (WELLBUTRIN XL) 150 MG 24 hr tablet Take 1 tablet (150 mg total) by mouth daily. 11/04/23   Jeani Sow, MD  cyclobenzaprine (FLEXERIL) 5 MG tablet Take by mouth as needed.    [provider]  desvenlafaxine (PRISTIQ) 100 MG 24 hr tablet Take 1 tablet (100 mg total) by mouth daily. 10/05/23   Jeani Sow, MD  esomeprazole (NEXIUM) 40 MG capsule Take 1 capsule (40 mg total) by mouth daily before breakfast. 05/25/23   Jeani Sow, MD  furosemide (LASIX) 20 MG tablet Take 1 tablet (20 mg total) by mouth as needed. 02/11/22   Jeani Sow, MD  hydrOXYzine (VISTARIL) 25 MG capsule Take 1 capsule (25 mg total) by mouth every 8 (eight)  hours as needed. 08/14/23   Jeani Sow, MD  Iron-Vitamin C (VITRON-C) 65-125 MG TABS Take 1 tablet by mouth daily. 09/11/23   [name], MD  potassium chloride (MICRO-K) 10 MEQ CR capsule Take 2 capsules (20 mEq total) by mouth as needed. 02/11/22   Jeani Sow, MD  promethazine (PHENERGAN) 25 MG tablet Take 1 tablet (25 mg total) by mouth every 6 (six) hours as needed for nausea 08/14/23   Jeani Sow, MD  Ubrogepant (UBRELVY) 50 MG TABS Take 1 tablet (50 mg total) by mouth daily as needed. 09/30/23   Jeani Sow, MD  zolpidem (AMBIEN CR) 6.25 MG CR tablet Take 1 tablet (6.25 mg total) by mouth at bedtime as needed for sleep. 08/14/23 02/10/24  Jeani Sow, MD    Family History Family History  Problem Relation Age of Onset   Hyperlipidemia Mother    Arthritis Mother    Hypertension Mother    Diabetes Mother    Hyperlipidemia Father    Alcohol abuse Father    Arthritis Father    Hypertension Father    Breast cancer Sister 35   Breast cancer Paternal Aunt    Stomach cancer Paternal Uncle    Cancer Paternal Uncle        unk type   Alcohol abuse Maternal Grandfather    Diabetes Paternal Grandmother    Throat cancer Paternal Grandfather        dx. 1s   Birth defects Paternal Grandfather    Alcohol abuse Paternal Grandfather     Social History Social History   Tobacco Use   Smoking status: Never   Smokeless tobacco: Never  Vaping Use   Vaping status: Never Used  Substance Use Topics   Alcohol use: Not Currently   Drug use: Never     Allergies   Tetanus toxoid, Topiramate, Venlafaxine, and Latex   Review of Systems Review of Systems   Physical Exam Triage Vital Signs ED Triage Vitals  Encounter Vitals Group     BP      Systolic BP Percentile      Diastolic BP Percentile      Pulse      Resp      Temp      Temp src      SpO2      Weight      Height      Head Circumference      Peak Flow      Pain Score      Pain Loc      Pain Education      Exclude from Growth Chart    No data found.  Updated Vital Signs There were no vitals taken for this visit.  Visual Acuity Right Eye Distance:   Left Eye Distance:   Bilateral Distance:    Right Eye Near:   Left Eye Near:    Bilateral Near:     Physical Exam Constitutional:      Appearance: Normal appearance.  Eyes:     Extraocular Movements: Extraocular movements intact.  Pulmonary:     Effort: Pulmonary effort is normal.  Genitourinary:    Comments: deferred Neurological:     Mental Status:  She is alert and oriented to person, place, and time. Mental status is at baseline.      UC Treatments / Results  Labs (all labs ordered are listed, but only abnormal results are displayed) Labs Reviewed - No data to display  EKG   Radiology No results found.  Procedures Procedures (including critical care time)  Medications Ordered in UC Medications - No data to display  Initial Impression / Assessment and Plan / UC Course  I have reviewed the triage vital signs and the nursing notes.  Pertinent labs & imaging results that were available during my care of the patient were reviewed by me and considered in my medical decision making (see chart for details).  Routine screening for STI   STI labs pending will treat per protocol, advised abstinence until lab results, and/or treatment is complete, advised condom use during all sexual encounters moving, may follow-up with urgent care as needed  Final Clinical Impressions(s) / UC Diagnoses   Final diagnoses:  Routine screening for STI (sexually transmitted infection)     Discharge Instructions      Labs pending 2-3 days, you will be contacted if positive for any sti and treatment will be sent to the pharmacy, you will have to return to the clinic if positive for gonorrhea to receive treatment   Please refrain from having sex until labs results, if positive please refrain from having sex until treatment complete and symptoms resolve   If positive for , Chlamydia  gonorrhea or trichomoniasis please notify partner or partners so they may tested as well  Moving forward, it is recommended you use some form of protection against the transmission of sti infections  such as condoms or dental dams with each sexual encounter     ED Prescriptions   None    PDMP not reviewed this encounter.   Valinda Hoar, NP 11/10/23 [phone]

## 2023-11-10 NOTE — ED Triage Notes (Signed)
Provider triage  

## 2023-11-11 LAB — CERVICOVAGINAL ANCILLARY ONLY
Bacterial Vaginitis (gardnerella): POSITIVE — AB
Candida Glabrata: NEGATIVE
Candida Vaginitis: NEGATIVE
Chlamydia: NEGATIVE
Comment: NEGATIVE
Comment: NEGATIVE
Comment: NEGATIVE
Comment: NEGATIVE
Comment: NEGATIVE
Comment: NORMAL
Neisseria Gonorrhea: NEGATIVE
Trichomonas: NEGATIVE

## 2023-11-13 ENCOUNTER — Other Ambulatory Visit: Payer: Self-pay | Admitting: Family Medicine

## 2023-11-13 DIAGNOSIS — Z1231 Encounter for screening mammogram for malignant neoplasm of breast: Secondary | ICD-10-CM

## 2023-11-14 DIAGNOSIS — J069 Acute upper respiratory infection, unspecified: Secondary | ICD-10-CM | POA: Diagnosis not present

## 2023-11-16 ENCOUNTER — Ambulatory Visit (INDEPENDENT_AMBULATORY_CARE_PROVIDER_SITE_OTHER): Payer: 59

## 2023-11-16 ENCOUNTER — Ambulatory Visit
Admission: EM | Admit: 2023-11-16 | Discharge: 2023-11-16 | Disposition: A | Discharge status: Home / Self Care | Payer: 59 | Attending: Internal Medicine | Admitting: Internal Medicine

## 2023-11-16 DIAGNOSIS — R059 Cough, unspecified: Secondary | ICD-10-CM | POA: Diagnosis not present

## 2023-11-16 DIAGNOSIS — J069 Acute upper respiratory infection, unspecified: Secondary | ICD-10-CM | POA: Diagnosis not present

## 2023-11-16 DIAGNOSIS — R051 Acute cough: Secondary | ICD-10-CM | POA: Diagnosis not present

## 2023-11-16 DIAGNOSIS — R0989 Other specified symptoms and signs involving the circulatory and respiratory systems: Secondary | ICD-10-CM | POA: Diagnosis not present

## 2023-11-16 DIAGNOSIS — Z2089 Contact with and (suspected) exposure to other communicable diseases: Secondary | ICD-10-CM

## 2023-11-16 DIAGNOSIS — R0602 Shortness of breath: Secondary | ICD-10-CM | POA: Diagnosis not present

## 2023-11-16 DIAGNOSIS — R509 Fever, unspecified: Secondary | ICD-10-CM | POA: Diagnosis not present

## 2023-11-16 LAB — POC COVID19/FLU A&B COMBO
Covid Antigen, POC: NEGATIVE
Influenza A Antigen, POC: NEGATIVE
Influenza B Antigen, POC: NEGATIVE

## 2023-11-16 MED ORDER — BENZONATATE 200 MG PO CAPS
200.0000 mg | ORAL_CAPSULE | Freq: Three times a day (TID) | ORAL | 0 refills | Status: DC | PRN
  Filled 2023-11-16: qty 20, 7d supply, fill #0

## 2023-11-16 MED ORDER — ALBUTEROL SULFATE HFA 108 (90 BASE) MCG/ACT IN AERS
1.0000 | INHALATION_SPRAY | Freq: Four times a day (QID) | RESPIRATORY_TRACT | 0 refills | Status: DC | PRN
  Filled 2023-11-16: qty 6.7, 25d supply, fill #0

## 2023-11-16 MED ORDER — AZITHROMYCIN 250 MG PO TABS
250.0000 mg | ORAL_TABLET | Freq: Every day | ORAL | 0 refills | Status: DC
  Filled 2023-11-16: qty 6, 5d supply, fill #0

## 2023-11-16 NOTE — Discharge Instructions (Signed)
Start Zithromax as prescribed.  Tessalon as needed for cough.  Albuterol inhaler as needed for shortness of breath.  Lots of rest and fluids.  Please follow-up with your PCP in 2 to 3 days for recheck.  Please go to the ER for any worsening symptoms.  I hope you feel better soon!

## 2023-11-16 NOTE — ED Provider Notes (Signed)
[facility]    CSN: [id number] Arrival date & time: 11/16/23  1758      History   Chief Complaint Chief Complaint  Patient presents with   Cough   Sore Throat         HPI Courtney Hanson is a 47 y.o. female  presents for evaluation of URI symptoms for 4 days. Patient reports associated symptoms of cough, congestion, fevers, body aches, shortness of breath.  States she did have a sore throat but that is since resolved.  Denies N/V/D, fevers, ear pain. Patient does not have a hx of asthma. Patient does not have a history of smoking.  Reports her niece had pneumonia.  Pt has taken NyQuil and Sudafed with OTC for symptoms. Pt has no other concerns at this time.    Cough Associated symptoms: fever and shortness of breath   Sore Throat Associated symptoms include shortness of breath.    Past Medical History:  Diagnosis Date   Anxiety    GERD (gastroesophageal reflux disease)    Lower extremity edema    Pulmonary embolism [facility])     Patient Active Problem List   Diagnosis Date Noted   Migraine without aura and without status migrainosus, not intractable 08/15/2023   History of DVT (deep vein thrombosis) 09/13/2022   IDA (iron deficiency anemia) 09/13/2022   Genetic testing 03/18/2022   History of pulmonary embolism 02/25/2022   Family history of breast cancer 02/25/2022   History of DVT of lower extremity 02/25/2022   Pulmonary embolism (HCC) 02/11/2022   Primary insomnia 02/11/2022   Localized edema 02/11/2022   Chronic diarrhea    Adenomatous rectal polyp    Nausea without vomiting    Chronic GERD    Erosive gastritis    Prediabetes 10/18/2020   Chronic right-sided thoracic back pain 09/23/2019   Vitamin D deficiency 06/04/2017   Tachycardia 06/19/2016   Major depressive disorder, recurrent episode, moderate (HCC) 07/13/2015   PMDD (premenstrual dysphoric disorder) 12/21/2013   GAD (generalized anxiety disorder) 06/24/2013   Morbid obesity with BMI of  60.0-69.9, adult (HCC) 06/24/2013   Endometritis 05/31/2013    Past Surgical History:  Procedure Laterality Date   CHOLECYSTECTOMY, LAPAROSCOPIC N/A 2003   COLONOSCOPY WITH PROPOFOL N/A 10/17/2021   Procedure: COLONOSCOPY WITH PROPOFOL;  Surgeon: Toney Reil, MD;  Location: [facility] ENDOSCOPY;  Service: Gastroenterology;  Laterality: N/A;   DILITATION & CURRETTAGE/HYSTROSCOPY WITH ESSURE     ESOPHAGOGASTRODUODENOSCOPY N/A 10/17/2021   Procedure: ESOPHAGOGASTRODUODENOSCOPY (EGD);  Surgeon: Toney Reil, MD;  Location: [facility] ENDOSCOPY;  Service: Gastroenterology;  Laterality: N/A;   LEEP N/A 2003    OB History   No obstetric history on file.      Home Medications    Prior to Admission medications   Medication Sig Start Date End Date Taking? Authorizing Provider  albuterol (VENTOLIN HFA) 108 (90 Base) MCG/ACT inhaler Inhale 1-2 puffs into the lungs every 6 (six) hours as needed. 11/16/23  Yes Radford Pax, NP  azithromycin (ZITHROMAX) 250 MG tablet Take 1 tablet (250 mg total) by mouth daily. Take first 2 tablets together, then 1 every day until finished. 11/16/23  Yes Radford Pax, NP  benzonatate (TESSALON) 200 MG capsule Take 1 capsule (200 mg total) by mouth 3 (three) times daily as needed. 11/16/23  Yes Radford Pax, NP  ALPRAZolam Prudy Feeler) 0.5 MG tablet Take 1 tablet (0.5 mg total) by mouth 2 (two) times daily as needed. 08/14/23   Lutricia Horsfall  Hilda Lias, MD  apixaban (ELIQUIS) 2.5 MG TABS tablet Take 1 tablet (2.5 mg total) by mouth 2 (two) times daily. 09/11/23   [name], MD  buPROPion (WELLBUTRIN XL) 150 MG 24 hr tablet Take 1 tablet (150 mg total) by mouth daily. 11/04/23   Jeani Sow, MD  cyclobenzaprine (FLEXERIL) 5 MG tablet Take by mouth as needed.    [provider]  desvenlafaxine (PRISTIQ) 100 MG 24 hr tablet Take 1 tablet (100 mg total) by mouth daily. 10/05/23   Jeani Sow, MD  esomeprazole (NEXIUM) 40 MG capsule Take 1 capsule (40 mg total) by  mouth daily before breakfast. 05/25/23   Jeani Sow, MD  furosemide (LASIX) 20 MG tablet Take 1 tablet (20 mg total) by mouth as needed. 02/11/22   Jeani Sow, MD  hydrOXYzine (VISTARIL) 25 MG capsule Take 1 capsule (25 mg total) by mouth every 8 (eight) hours as needed. 08/14/23   Jeani Sow, MD  Iron-Vitamin C (VITRON-C) 65-125 MG TABS Take 1 tablet by mouth daily. 09/11/23   [name], MD  potassium chloride (MICRO-K) 10 MEQ CR capsule Take 2 capsules (20 mEq total) by mouth as needed. 02/11/22   Jeani Sow, MD  promethazine (PHENERGAN) 25 MG tablet Take 1 tablet (25 mg total) by mouth every 6 (six) hours as needed for nausea 08/14/23   Jeani Sow, MD  Ubrogepant (UBRELVY) 50 MG TABS Take 1 tablet (50 mg total) by mouth daily as needed. 09/30/23   Jeani Sow, MD  zolpidem (AMBIEN CR) 6.25 MG CR tablet Take 1 tablet (6.25 mg total) by mouth at bedtime as needed for sleep. 08/14/23 02/10/24  Jeani Sow, MD    Family History Family History  Problem Relation Age of Onset   Hyperlipidemia Mother    Arthritis Mother    Hypertension Mother    Diabetes Mother    Hyperlipidemia Father    Alcohol abuse Father    Arthritis Father    Hypertension Father    Breast cancer Sister 22   Breast cancer Paternal Aunt    Stomach cancer Paternal Uncle    Cancer Paternal Uncle        unk type   Alcohol abuse Maternal Grandfather    Diabetes Paternal Grandmother    Throat cancer Paternal Grandfather        dx. 57s   Birth defects Paternal Grandfather    Alcohol abuse Paternal Grandfather     Social History Social History   Tobacco Use   Smoking status: Never   Smokeless tobacco: Never  Vaping Use   Vaping status: Never Used  Substance Use Topics   Alcohol use: Not Currently   Drug use: Never     Allergies   Tetanus toxoid, Topiramate, Venlafaxine, and Latex   Review of Systems Review of Systems  Constitutional:  Positive for fever.  HENT:  Positive for  congestion.   Respiratory:  Positive for cough and shortness of breath.      Physical Exam Triage Vital Signs ED Triage Vitals  Encounter Vitals Group     BP 11/16/23 1920 (!) 148/86     Systolic BP Percentile --      Diastolic BP Percentile --      Pulse Rate 11/16/23 1920 (!) 119     Resp 11/16/23 1920 18     Temp 11/16/23 1920 99.1 F (37.3 C)     Temp Source 11/16/23 1920 Oral  SpO2 11/16/23 1920 94 %     Weight --      Height --      Head Circumference --      Peak Flow --      Pain Score 11/16/23 1925 4     Pain Loc --      Pain Education --      Exclude from Growth Chart --    No data found.  Updated Vital Signs BP (!) 148/86 (BP Location: Right Arm)   Pulse (!) 119 Comment: States her heart rate is always high  Temp 99.1 F (37.3 C) (Oral)   Resp 18   LMP  (Within Weeks) Comment: 2 weeks  SpO2 94%   Visual Acuity Right Eye Distance:   Left Eye Distance:   Bilateral Distance:    Right Eye Near:   Left Eye Near:    Bilateral Near:     Physical Exam Vitals and nursing note reviewed.  Constitutional:      General: She is not in acute distress.    Appearance: She is well-developed. She is not ill-appearing.  HENT:     Head: Normocephalic and atraumatic.     Right Ear: Tympanic membrane and ear canal normal.     Left Ear: Tympanic membrane and ear canal normal.     Nose: Congestion present.     Mouth/Throat:     Mouth: Mucous membranes are moist.     Pharynx: Oropharynx is clear. Uvula midline. No posterior oropharyngeal erythema.     Tonsils: No tonsillar exudate or tonsillar abscesses.  Eyes:     Conjunctiva/sclera: Conjunctivae normal.     Pupils: Pupils are equal, round, and reactive to light.  Cardiovascular:     Rate and Rhythm: Normal rate and regular rhythm.     Heart sounds: Normal heart sounds.  Pulmonary:     Effort: Pulmonary effort is normal. No respiratory distress.     Breath sounds: Normal breath sounds. No wheezing.   Musculoskeletal:     Cervical back: Normal range of motion and neck supple.  Lymphadenopathy:     Cervical: No cervical adenopathy.  Skin:    General: Skin is warm and dry.  Neurological:     General: No focal deficit present.     Mental Status: She is alert and oriented to person, place, and time.  Psychiatric:        Mood and Affect: Mood normal.        Behavior: Behavior normal.      UC Treatments / Results  Labs (all labs ordered are listed, but only abnormal results are displayed) Labs Reviewed  POC COVID19/FLU A&B COMBO    EKG   Radiology No results found.  Procedures Procedures (including critical care time)  Medications Ordered in UC Medications - No data to display  Initial Impression / Assessment and Plan / UC Course  I have reviewed the triage vital signs and the nursing notes.  Pertinent labs & imaging results that were available during my care of the patient were reviewed by me and considered in my medical decision making (see chart for details).     Reviewed exam and symptoms with patient.  No red flags.  Negative rapid flu and COVID.  Wet read of chest x-ray without obvious consolidation, will contact with radiology overread once available if positive results.  Given exposure to pneumonia will start Zithromax.  Tessalon as needed for cough.  Albuterol inhaler as needed.  PCP follow-up 2 to  3 days for recheck.  ER precautions reviewed. Final Clinical Impressions(s) / UC Diagnoses   Final diagnoses:  Acute cough  Exposure to pneumonia  Acute upper respiratory infection     Discharge Instructions      Start Zithromax as prescribed.  Tessalon as needed for cough.  Albuterol inhaler as needed for shortness of breath.  Lots of rest and fluids.  Please follow-up with your PCP in 2 to 3 days for recheck.  Please go to the ER for any worsening symptoms.  I hope you feel better soon!     ED Prescriptions     Medication Sig Dispense Auth. Provider    benzonatate (TESSALON) 200 MG capsule Take 1 capsule (200 mg total) by mouth 3 (three) times daily as needed. 20 capsule Radford Pax, NP   albuterol (VENTOLIN HFA) 108 (90 Base) MCG/ACT inhaler Inhale 1-2 puffs into the lungs every 6 (six) hours as needed. 1 each Radford Pax, NP   azithromycin (ZITHROMAX) 250 MG tablet Take 1 tablet (250 mg total) by mouth daily. Take first 2 tablets together, then 1 every day until finished. 6 tablet Radford Pax, NP      PDMP not reviewed this encounter.   Radford Pax, NP 11/16/23 2012

## 2023-11-16 NOTE — ED Triage Notes (Signed)
Pt reports cough, body aches and sore throat x 4 days. Nyquil and Sudafed gives no relief.

## 2023-11-17 ENCOUNTER — Other Ambulatory Visit: Payer: Self-pay

## 2023-11-17 ENCOUNTER — Telehealth: Payer: Self-pay | Admitting: Oncology

## 2023-11-17 NOTE — Telephone Encounter (Signed)
Spoke with pt about appt changes in Feb 2025.Pt will see in mychart

## 2023-12-04 ENCOUNTER — Other Ambulatory Visit: Payer: Self-pay

## 2023-12-04 ENCOUNTER — Other Ambulatory Visit: Payer: Self-pay | Admitting: Family Medicine

## 2023-12-04 MED ORDER — DESVENLAFAXINE SUCCINATE ER 100 MG PO TB24
100.0000 mg | ORAL_TABLET | Freq: Every day | ORAL | 0 refills | Status: DC
  Filled 2023-12-04: qty 30, 30d supply, fill #0

## 2023-12-11 ENCOUNTER — Encounter: Payer: Self-pay | Admitting: Oncology

## 2023-12-11 ENCOUNTER — Ambulatory Visit (INDEPENDENT_AMBULATORY_CARE_PROVIDER_SITE_OTHER): Payer: Self-pay | Admitting: Psychology

## 2023-12-11 ENCOUNTER — Ambulatory Visit
Admission: RE | Admit: 2023-12-11 | Discharge: 2023-12-11 | Disposition: A | Discharge status: Home / Self Care | Payer: No Typology Code available for payment source | Source: Ambulatory Visit | Attending: Family Medicine | Admitting: Family Medicine

## 2023-12-11 ENCOUNTER — Encounter: Payer: Self-pay | Admitting: Family Medicine

## 2023-12-11 ENCOUNTER — Ambulatory Visit (INDEPENDENT_AMBULATORY_CARE_PROVIDER_SITE_OTHER): Payer: No Typology Code available for payment source | Admitting: Family Medicine

## 2023-12-11 ENCOUNTER — Ambulatory Visit: Payer: 59

## 2023-12-11 VITALS — BP 117/83 | HR 99 | Temp 98.1°F | Resp 18 | Ht 61.0 in | Wt 338.2 lb

## 2023-12-11 DIAGNOSIS — Z1231 Encounter for screening mammogram for malignant neoplasm of breast: Secondary | ICD-10-CM | POA: Diagnosis not present

## 2023-12-11 DIAGNOSIS — F32A Depression, unspecified: Secondary | ICD-10-CM | POA: Diagnosis not present

## 2023-12-11 DIAGNOSIS — F4321 Adjustment disorder with depressed mood: Secondary | ICD-10-CM | POA: Diagnosis not present

## 2023-12-11 DIAGNOSIS — F419 Anxiety disorder, unspecified: Secondary | ICD-10-CM | POA: Diagnosis not present

## 2023-12-11 DIAGNOSIS — G5602 Carpal tunnel syndrome, left upper limb: Secondary | ICD-10-CM

## 2023-12-11 NOTE — Assessment & Plan Note (Signed)
 Chronic.  Has been on mult meds.  Continue pristiq 100mg .  Improved but not ideal on wellbutrin XL 150mg  daily. Would be inclined to increase to 300mg  but seeing psych today so will defer tx to them.
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
## 2023-12-11 NOTE — Progress Notes (Signed)
 Subjective:     Patient ID: Courtney Hanson, female    DOB: February 03, 1976, 48 y.o.   MRN: [id number]  Chief Complaint  Patient presents with   Follow-up    4 week follow-up on moods     HPI-15min late  Anxiety/depression-has been on several meds  currently on pristiq  100mg .  Added wellbutrin  xl 150mg  last month. Referral in place for psych-has appt today.  Counselling hasn't been helpful for her in the past.  Doing better but could be better  no SI.  Anxiety not worse.   Some exercise.  Not crying as much.  L hand pain and index and ring going numb a lot.  Wearing splint.  Health Maintenance Due  Topic Date Due   Hepatitis C Screening  Never done    Past Medical History:  Diagnosis Date   Anxiety    GERD (gastroesophageal reflux disease)    Lower extremity edema    Pulmonary embolism (HCC)     Past Surgical History:  Procedure Laterality Date   CHOLECYSTECTOMY, LAPAROSCOPIC N/A 2003   COLONOSCOPY WITH PROPOFOL  N/A 10/17/2021   Procedure: COLONOSCOPY WITH PROPOFOL ;  Surgeon: Unk Corinn Skiff, MD;  Location: [facility] ENDOSCOPY;  Service: Gastroenterology;  Laterality: N/A;   DILITATION & CURRETTAGE/HYSTROSCOPY WITH ESSURE     ESOPHAGOGASTRODUODENOSCOPY N/A 10/17/2021   Procedure: ESOPHAGOGASTRODUODENOSCOPY (EGD);  Surgeon: Unk Corinn Skiff, MD;  Location: [facility] ENDOSCOPY;  Service: Gastroenterology;  Laterality: N/A;   LEEP N/A 2003     Current Outpatient Medications:    albuterol  (VENTOLIN  HFA) 108 (90 Base) MCG/ACT inhaler, Inhale 1-2 puffs into the lungs every 6 (six) hours as needed., Disp: 6.7 g, Rfl: 0   ALPRAZolam  (XANAX ) 0.5 MG tablet, Take 1 tablet (0.5 mg total) by mouth 2 (two) times daily as needed., Disp: 30 tablet, Rfl: 1   apixaban  (ELIQUIS ) 2.5 MG TABS tablet, Take 1 tablet (2.5 mg total) by mouth 2 (two) times daily., Disp: 60 tablet, Rfl: 5   buPROPion  (WELLBUTRIN  XL) 150 MG 24 hr tablet, Take 1 tablet (150 mg total) by mouth daily., Disp: 30 tablet, Rfl:  1   cyclobenzaprine (FLEXERIL) 5 MG tablet, Take by mouth as needed., Disp: , Rfl:    desvenlafaxine  (PRISTIQ ) 100 MG 24 hr tablet, Take 1 tablet (100 mg total) by mouth daily., Disp: 30 tablet, Rfl: 0   esomeprazole  (NEXIUM ) 40 MG capsule, Take 1 capsule (40 mg total) by mouth daily before breakfast., Disp: 90 capsule, Rfl: 0   furosemide  (LASIX ) 20 MG tablet, Take 1 tablet (20 mg total) by mouth as needed., Disp: 30 tablet, Rfl: 3   hydrOXYzine  (VISTARIL ) 25 MG capsule, Take 1 capsule (25 mg total) by mouth every 8 (eight) hours as needed., Disp: 30 capsule, Rfl: 3   Iron -Vitamin C  (VITRON-C) 65-125 MG TABS, Take 1 tablet by mouth daily., Disp: 90 tablet, Rfl: 1   potassium chloride  (MICRO-K ) 10 MEQ CR capsule, Take 2 capsules (20 mEq total) by mouth as needed., Disp: 30 capsule, Rfl: 3   promethazine  (PHENERGAN ) 25 MG tablet, Take 1 tablet (25 mg total) by mouth every 6 (six) hours as needed for nausea, Disp: 30 tablet, Rfl: 2   Ubrogepant  (UBRELVY ) 50 MG TABS, Take 1 tablet (50 mg total) by mouth daily as needed., Disp: 16 tablet, Rfl: 2   zolpidem  (AMBIEN  CR) 6.25 MG CR tablet, Take 1 tablet (6.25 mg total) by mouth at bedtime as needed for sleep., Disp: 30 tablet, Rfl: 1  Allergies  Allergen  Reactions   Tetanus Toxoid Dermatitis, Hives, Itching and Rash   Topiramate     Shaking hands & whoozy   Venlafaxine     Elevation of blood pressure   Latex     Latex birth Control   ROS neg/noncontributory except as noted HPI/below      Objective:     BP 117/83   Pulse 99   Temp 98.1 F (36.7 C) (Temporal)   Resp 18   Ht 5' 1 (1.549 m)   Wt (!) 338 lb 4 oz (153.4 kg)   LMP 11/29/2023 (Approximate)   SpO2 96%   BMI 63.91 kg/m  Wt Readings from Last 3 Encounters:  12/11/23 (!) 338 lb 4 oz (153.4 kg)  08/14/23 (!) 332 lb (150.6 kg)  09/17/22 (!) 305 lb (138.3 kg)    Physical Exam   Gen: WDWN NAD HEENT: NCAT, conjunctiva not injected, sclera nonicteric CARDIAC: RRR, S1S2+, no  murmur. MSK: no gross abnormalities.  NEURO: A&O x3.  CN II-XII intact.  PSYCH: normal mood. Good eye contact     Assessment & Plan:  Anxiety and depression Assessment & Plan: Chronic.  Has been on mult meds.  Continue pristiq  100mg .  Improved but not ideal on wellbutrin  XL 150mg  daily. Would be inclined to increase to 300mg  but seeing psych today so will defer tx to them.     Carpal tunnel syndrome of left wrist  2  CTS L-pt to call sports med as not improved w/splint  Return in about 6 months (around 06/09/2024) for annual physical.  Jenkins CHRISTELLA Carrel, MD

## 2023-12-11 NOTE — Patient Instructions (Signed)
 It was very nice to see you today!  [facility]  [address] on the 1st floor Phone number [phone]    PLEASE NOTE:  If you had any lab tests please let us know if you have not heard back within a few days. You may see your results on MyChart before we have a chance to review them but we will give you a call once they are reviewed by [place]. If we ordered any referrals today, please let us know if you have not heard from their office within the next week.   Please try these tips to maintain a healthy lifestyle:  Eat most of your calories during the day when you are active. Eliminate processed foods including packaged sweets (pies, cakes, cookies), reduce intake of potatoes, white bread, white pasta, and white rice. Look for whole grain options, oat flour or almond flour.  Each meal should contain half fruits/vegetables, one quarter protein, and one quarter carbs (no bigger than a computer mouse).  Cut down on sweet beverages. This includes juice, soda, and sweet tea. Also watch fruit intake, though this is a healthier sweet option, it still contains natural sugar! Limit to 3 servings daily.  Drink at least 1 glass of water with each meal and aim for at least 8 glasses per day  Exercise at least 150 minutes every week.

## 2023-12-16 ENCOUNTER — Encounter: Payer: Self-pay | Admitting: Family Medicine

## 2023-12-17 ENCOUNTER — Encounter: Payer: Self-pay | Admitting: Oncology

## 2023-12-18 ENCOUNTER — Encounter: Payer: Self-pay | Admitting: Family Medicine

## 2023-12-18 ENCOUNTER — Ambulatory Visit (INDEPENDENT_AMBULATORY_CARE_PROVIDER_SITE_OTHER): Payer: 59 | Admitting: Family Medicine

## 2023-12-18 ENCOUNTER — Encounter: Payer: Self-pay | Admitting: Oncology

## 2023-12-18 ENCOUNTER — Inpatient Hospital Stay: Payer: 59

## 2023-12-18 ENCOUNTER — Ambulatory Visit
Admission: RE | Admit: 2023-12-18 | Discharge: 2023-12-18 | Disposition: A | Discharge status: Home / Self Care | Payer: 59 | Attending: Family Medicine | Admitting: Family Medicine

## 2023-12-18 ENCOUNTER — Other Ambulatory Visit: Payer: Self-pay

## 2023-12-18 ENCOUNTER — Ambulatory Visit
Admission: RE | Admit: 2023-12-18 | Discharge: 2023-12-18 | Disposition: A | Discharge status: Home / Self Care | Payer: 59 | Source: Ambulatory Visit | Attending: Family Medicine

## 2023-12-18 VITALS — BP 124/88 | HR 108 | Ht 61.0 in

## 2023-12-18 DIAGNOSIS — M79602 Pain in left arm: Secondary | ICD-10-CM | POA: Diagnosis not present

## 2023-12-18 DIAGNOSIS — R202 Paresthesia of skin: Secondary | ICD-10-CM | POA: Insufficient documentation

## 2023-12-18 DIAGNOSIS — M47812 Spondylosis without myelopathy or radiculopathy, cervical region: Secondary | ICD-10-CM | POA: Diagnosis not present

## 2023-12-18 DIAGNOSIS — G8929 Other chronic pain: Secondary | ICD-10-CM | POA: Diagnosis not present

## 2023-12-18 MED ORDER — GABAPENTIN 300 MG PO CAPS
300.0000 mg | ORAL_CAPSULE | Freq: Every day | ORAL | 1 refills | Status: AC
  Filled 2023-12-18 (×2): qty 30, 30d supply, fill #0
  Filled 2024-02-18: qty 30, 30d supply, fill #1

## 2023-12-18 MED ORDER — MELOXICAM 15 MG PO TABS
15.0000 mg | ORAL_TABLET | Freq: Every day | ORAL | 1 refills | Status: DC
  Filled 2023-12-18 (×2): qty 30, 30d supply, fill #0

## 2023-12-18 NOTE — Assessment & Plan Note (Signed)
 History of Present Illness Miss Harter, right-hand-dominant, a patient with a history of bilateral wrist pain, presents with worsening symptoms over the past six months. The pain, which originates from the shoulder and extends to the wrist, is more severe on the left side. The patient reports that the pain is particularly intense at night, often disrupting sleep. Over the past decade, the patient has experienced intermittent hand numbness, which could be alleviated by shaking the hand. However, in the last six months, the symptoms have escalated, with the left hand experiencing persistent numbness and pain. The patient has been using carpal tunnel night splints and taking Advil for pain management nightly as of late, but these interventions have not provided significant relief. The patient also notes an increase in left-hand usage at work, which may be contributing to the exacerbation of symptoms.  Physical Exam MUSCULOSKELETAL: Full painless cervical range of motion observed. Normal muscle strength in upper extremities. Negative Phalen's test bilaterally. Negative Tinel's sign at the radial aspect of the wrist.  Sensorimotor function intact in bilateral radial and ulnar distributions.  Subtle thenar atrophy bilaterally. Negative Spurling's test on the right, equivocal Spurling's test on the left.  2+ brachioradialis reflexes.  Assessment and Plan Bilateral upper extremity numbness, worse on the left Symptoms have been present for about ten years, but have worsened over the past six months. The numbness is more pronounced in the left arm, extending from the shoulder to the hand, and is particularly severe in the first three digits. The right hand also experiences numbness, but to a lesser extent. The numbness is severe enough to wake the patient from sleep. Physical examination revealed mild thenar atrophy bilaterally, however intact sensorimotor function in radial, median, and ulnar distributions at the  hand with negative Tinel's and Phalen's tests bilaterally.  Given the equivocal Spurling's test on the left, primary concern is for cervical etiology. -Order cervical spine x-ray. -Start Gabapentin  and Meloxicam , to be taken at dinnertime with food. -Discontinue use of night splints for carpal tunnel syndrome. -Plan to reassess after imaging and initial medication trial. If symptoms do not improve significantly, consider nerve conduction studies to further evaluate for carpal tunnel syndrome and/or cervical radiculopathy.

## 2023-12-18 NOTE — Patient Instructions (Signed)
 Your Plan:  - Bilateral Upper Extremity Numbness, Worse on the Left:   - A cervical spine x-ray has been ordered to investigate further.   - Begin taking Gabapentin  and Meloxicam  at dinnertime to manage pain and numbness.   - Discontinue the use of night splints for carpal tunnel syndrome.   - Reassess condition after imaging and initial medication trial.   - If no significant improvement, further tests may be considered to evaluate for carpal tunnel syndrome or cervical radiculopathy.

## 2023-12-18 NOTE — Progress Notes (Signed)
 Primary Care / Sports Medicine Office Visit  Patient Information:  Patient ID: Courtney Hanson, female DOB: [birth year] Age: 48 y.o. MRN: [id number]   Courtney Hanson is a pleasant 48 y.o. female presenting with the following:  Chief Complaint  Patient presents with   Numbness    Numbness in both hands L>R.  Left hand and finger numbness is worse over the last few months. Patient has been DX with carpal tunnel in the past. Patient has not had injection therapy or PT treatment for her hands. She would like to discuss her options for this.     Vitals:   12/18/23 1046  BP: 124/88  Pulse: (!) 108  SpO2: 94%   Vitals:   12/18/23 1046  Height: 5' 1 (1.549 m)   Body mass index is 63.91 kg/m.     Independent interpretation of notes and tests performed by another provider:   None  Procedures performed:   None  Pertinent History, Exam, Impression, and Recommendations:   Problem List Items Addressed This Visit     Paresthesia of arm - Primary   History of Present Illness Miss Gangwer, right-hand-dominant, a patient with a history of bilateral wrist pain, presents with worsening symptoms over the past six months. The pain, which originates from the shoulder and extends to the wrist, is more severe on the left side. The patient reports that the pain is particularly intense at night, often disrupting sleep. Over the past decade, the patient has experienced intermittent hand numbness, which could be alleviated by shaking the hand. However, in the last six months, the symptoms have escalated, with the left hand experiencing persistent numbness and pain. The patient has been using carpal tunnel night splints and taking Advil for pain management nightly as of late, but these interventions have not provided significant relief. The patient also notes an increase in left-hand usage at work, which may be contributing to the exacerbation of symptoms.  Physical Exam MUSCULOSKELETAL: Full  painless cervical range of motion observed. Normal muscle strength in upper extremities. Negative Phalen's test bilaterally. Negative Tinel's sign at the radial aspect of the wrist.  Sensorimotor function intact in bilateral radial and ulnar distributions.  Subtle thenar atrophy bilaterally. Negative Spurling's test on the right, equivocal Spurling's test on the left.  2+ brachioradialis reflexes.  Assessment and Plan Bilateral upper extremity numbness, worse on the left Symptoms have been present for about ten years, but have worsened over the past six months. The numbness is more pronounced in the left arm, extending from the shoulder to the hand, and is particularly severe in the first three digits. The right hand also experiences numbness, but to a lesser extent. The numbness is severe enough to wake the patient from sleep. Physical examination revealed mild thenar atrophy bilaterally, however intact sensorimotor function in radial, median, and ulnar distributions at the hand with negative Tinel's and Phalen's tests bilaterally.  Given the equivocal Spurling's test on the left, primary concern is for cervical etiology. -Order cervical spine x-ray. -Start Gabapentin  and Meloxicam , to be taken at dinnertime with food. -Discontinue use of night splints for carpal tunnel syndrome. -Plan to reassess after imaging and initial medication trial. If symptoms do not improve significantly, consider nerve conduction studies to further evaluate for carpal tunnel syndrome and/or cervical radiculopathy.      Relevant Medications   meloxicam  (MOBIC ) 15 MG tablet   gabapentin  (NEURONTIN ) 300 MG capsule   Other Relevant Orders   DG Cervical Spine  Complete (Completed)     Orders & Medications Medications:  Meds ordered this encounter  Medications   meloxicam  (MOBIC ) 15 MG tablet    Sig: Take 1 tablet (15 mg total) by mouth daily.    Dispense:  30 tablet    Refill:  1   gabapentin  (NEURONTIN ) 300 MG  capsule    Sig: Take 1 capsule (300 mg total) by mouth at bedtime.    Dispense:  30 capsule    Refill:  1   Orders Placed This Encounter  Procedures   DG Cervical Spine Complete     No follow-ups on file.     Selinda JINNY Ku, MD, [facility]   Primary Care Sports Medicine Primary Care and Sports Medicine at [facility]

## 2023-12-23 ENCOUNTER — Encounter: Payer: Self-pay | Admitting: Family Medicine

## 2023-12-23 NOTE — Telephone Encounter (Signed)
 Please review. Thanks!

## 2023-12-25 ENCOUNTER — Inpatient Hospital Stay: Payer: 59 | Attending: Oncology

## 2023-12-25 ENCOUNTER — Ambulatory Visit (INDEPENDENT_AMBULATORY_CARE_PROVIDER_SITE_OTHER): Payer: 59 | Admitting: Psychology

## 2023-12-25 VITALS — BP 121/72 | HR 99 | Temp 97.3°F | Resp 18

## 2023-12-25 DIAGNOSIS — D509 Iron deficiency anemia, unspecified: Secondary | ICD-10-CM | POA: Insufficient documentation

## 2023-12-25 DIAGNOSIS — D508 Other iron deficiency anemias: Secondary | ICD-10-CM

## 2023-12-25 DIAGNOSIS — F4321 Adjustment disorder with depressed mood: Secondary | ICD-10-CM | POA: Diagnosis not present

## 2023-12-25 MED ORDER — IRON SUCROSE 20 MG/ML IV SOLN
200.0000 mg | Freq: Once | INTRAVENOUS | Status: AC
  Administered 2023-12-25: 200 mg via INTRAVENOUS
  Filled 2023-12-25: qty 10
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
## 2023-12-28 ENCOUNTER — Other Ambulatory Visit: Payer: Self-pay

## 2023-12-28 ENCOUNTER — Other Ambulatory Visit: Payer: Self-pay | Admitting: Family Medicine

## 2024-01-01 ENCOUNTER — Other Ambulatory Visit: Payer: Self-pay | Admitting: Family Medicine

## 2024-01-01 ENCOUNTER — Inpatient Hospital Stay: Payer: 59

## 2024-01-01 ENCOUNTER — Encounter: Payer: Self-pay | Admitting: Family Medicine

## 2024-01-01 ENCOUNTER — Other Ambulatory Visit: Payer: Self-pay

## 2024-01-01 VITALS — BP 121/96 | HR 94 | Temp 98.0°F | Resp 16

## 2024-01-01 DIAGNOSIS — D508 Other iron deficiency anemias: Secondary | ICD-10-CM

## 2024-01-01 DIAGNOSIS — D509 Iron deficiency anemia, unspecified: Secondary | ICD-10-CM | POA: Diagnosis not present

## 2024-01-01 MED ORDER — BUPROPION HCL ER (XL) 150 MG PO TB24
150.0000 mg | ORAL_TABLET | Freq: Every day | ORAL | 1 refills | Status: DC
  Filled 2024-01-01: qty 30, 30d supply, fill #0

## 2024-01-01 MED ORDER — SODIUM CHLORIDE 0.9% FLUSH
10.0000 mL | Freq: Once | INTRAVENOUS | Status: AC | PRN
  Administered 2024-01-01: 10 mL
  Filled 2024-01-01: qty 10

## 2024-01-01 MED ORDER — IRON SUCROSE 20 MG/ML IV SOLN
200.0000 mg | Freq: Once | INTRAVENOUS | Status: AC
  Administered 2024-01-01: 200 mg via INTRAVENOUS
  Filled 2024-01-01: qty 10

## 2024-01-01 NOTE — Patient Instructions (Signed)

## 2024-01-04 ENCOUNTER — Ambulatory Visit: Payer: 59 | Admitting: Gastroenterology

## 2024-01-08 ENCOUNTER — Encounter: Payer: Self-pay | Admitting: Oncology

## 2024-01-08 ENCOUNTER — Inpatient Hospital Stay: Payer: 59

## 2024-01-08 ENCOUNTER — Ambulatory Visit: Payer: 59 | Admitting: Psychology

## 2024-01-08 VITALS — BP 113/73 | HR 94 | Temp 96.6°F | Resp 18

## 2024-01-08 DIAGNOSIS — D508 Other iron deficiency anemias: Secondary | ICD-10-CM

## 2024-01-08 DIAGNOSIS — D509 Iron deficiency anemia, unspecified: Secondary | ICD-10-CM | POA: Diagnosis not present

## 2024-01-08 MED ORDER — SODIUM CHLORIDE 0.9% FLUSH
10.0000 mL | Freq: Once | INTRAVENOUS | Status: AC | PRN
  Administered 2024-01-08: 10 mL
  Filled 2024-01-08: qty 10

## 2024-01-08 MED ORDER — IRON SUCROSE 20 MG/ML IV SOLN
200.0000 mg | Freq: Once | INTRAVENOUS | Status: AC
  Administered 2024-01-08: 200 mg via INTRAVENOUS
  Filled 2024-01-08: qty 10

## 2024-01-11 ENCOUNTER — Other Ambulatory Visit: Payer: 59

## 2024-01-13 ENCOUNTER — Other Ambulatory Visit: Payer: Self-pay | Admitting: Family Medicine

## 2024-01-13 ENCOUNTER — Other Ambulatory Visit: Payer: 59

## 2024-01-13 MED ORDER — DESVENLAFAXINE SUCCINATE ER 100 MG PO TB24
100.0000 mg | ORAL_TABLET | Freq: Every day | ORAL | 0 refills | Status: DC
  Filled 2024-01-13: qty 30, 30d supply, fill #0

## 2024-01-13 MED ORDER — DESVENLAFAXINE SUCCINATE ER 100 MG PO TB24
100.0000 mg | ORAL_TABLET | Freq: Every day | ORAL | 0 refills | Status: DC
  Filled 2024-01-13: qty 90, 90d supply, fill #0

## 2024-01-14 ENCOUNTER — Other Ambulatory Visit (HOSPITAL_COMMUNITY): Payer: Self-pay

## 2024-01-14 ENCOUNTER — Other Ambulatory Visit: Payer: Self-pay

## 2024-01-15 ENCOUNTER — Ambulatory Visit: Payer: 59 | Admitting: Oncology

## 2024-01-15 ENCOUNTER — Inpatient Hospital Stay: Payer: 59

## 2024-01-15 ENCOUNTER — Ambulatory Visit: Payer: 59

## 2024-01-15 ENCOUNTER — Inpatient Hospital Stay: Payer: 59 | Attending: Oncology

## 2024-01-15 VITALS — BP 116/83 | HR 93 | Temp 97.2°F | Resp 18

## 2024-01-15 DIAGNOSIS — Z79899 Other long term (current) drug therapy: Secondary | ICD-10-CM | POA: Diagnosis not present

## 2024-01-15 DIAGNOSIS — D508 Other iron deficiency anemias: Secondary | ICD-10-CM

## 2024-01-15 DIAGNOSIS — Z7901 Long term (current) use of anticoagulants: Secondary | ICD-10-CM | POA: Diagnosis not present

## 2024-01-15 DIAGNOSIS — Z86718 Personal history of other venous thrombosis and embolism: Secondary | ICD-10-CM | POA: Diagnosis not present

## 2024-01-15 DIAGNOSIS — D509 Iron deficiency anemia, unspecified: Secondary | ICD-10-CM | POA: Diagnosis not present

## 2024-01-15 DIAGNOSIS — Z86711 Personal history of pulmonary embolism: Secondary | ICD-10-CM | POA: Insufficient documentation

## 2024-01-15 LAB — CBC WITH DIFFERENTIAL (CANCER CENTER ONLY)
Abs Immature Granulocytes: 0.02 10*3/uL (ref 0.00–0.07)
Basophils Absolute: 0 10*3/uL (ref 0.0–0.1)
Basophils Relative: 1 %
Eosinophils Absolute: 0.1 10*3/uL (ref 0.0–0.5)
Eosinophils Relative: 3 %
HCT: 39.4 % (ref 36.0–46.0)
Hemoglobin: 12.2 g/dL (ref 12.0–15.0)
Immature Granulocytes: 0 %
Lymphocytes Relative: 46 %
Lymphs Abs: 2.5 10*3/uL (ref 0.7–4.0)
MCH: 27.4 pg (ref 26.0–34.0)
MCHC: 31 g/dL (ref 30.0–36.0)
MCV: 88.3 fL (ref 80.0–100.0)
Monocytes Absolute: 0.4 10*3/uL (ref 0.1–1.0)
Monocytes Relative: 7 %
Neutro Abs: 2.4 10*3/uL (ref 1.7–7.7)
Neutrophils Relative %: 43 %
Platelet Count: 228 10*3/uL (ref 150–400)
RBC: 4.46 MIL/uL (ref 3.87–5.11)
RDW: 16.1 % — ABNORMAL HIGH (ref 11.5–15.5)
Smear Review: NORMAL
WBC Count: 5.4 10*3/uL (ref 4.0–10.5)
nRBC: 0 % (ref 0.0–0.2)

## 2024-01-15 LAB — IRON AND TIBC
Iron: 115 ug/dL (ref 28–170)
Saturation Ratios: 27 % (ref 10.4–31.8)
TIBC: 424 ug/dL (ref 250–450)
UIBC: 309 ug/dL

## 2024-01-15 LAB — FERRITIN: Ferritin: 72 ng/mL (ref 11–307)

## 2024-01-15 MED ORDER — IRON SUCROSE 20 MG/ML IV SOLN
200.0000 mg | Freq: Once | INTRAVENOUS | Status: AC
  Administered 2024-01-15: 200 mg via INTRAVENOUS

## 2024-01-15 NOTE — Patient Instructions (Signed)

## 2024-01-18 ENCOUNTER — Ambulatory Visit: Payer: 59

## 2024-01-18 ENCOUNTER — Ambulatory Visit: Payer: 59 | Admitting: Oncology

## 2024-01-20 ENCOUNTER — Other Ambulatory Visit (HOSPITAL_COMMUNITY): Payer: Self-pay

## 2024-01-21 NOTE — Assessment & Plan Note (Signed)
Recommend Vitron C daily, she is currently not taking on a daily basis. Lab Results  Component Value Date   HGB 12.2 01/15/2024   TIBC 424 01/15/2024   IRONPCTSAT 27 01/15/2024   FERRITIN 72 01/15/2024    We discussed the option of IV Venofer treatments.  She initially prefers to try oral iron supplementation  Hemoglobin has slightly improved.  Persistent iron deficient. Reached out to patient and she agrees with IV venofer  Rationale and potential side effects were discussed with her. She agrees with treatment.  Plan Venofer weekly x 4.

## 2024-01-22 ENCOUNTER — Other Ambulatory Visit: Payer: Self-pay

## 2024-01-22 ENCOUNTER — Encounter: Payer: Self-pay | Admitting: Oncology

## 2024-01-22 ENCOUNTER — Inpatient Hospital Stay: Payer: 59

## 2024-01-22 ENCOUNTER — Inpatient Hospital Stay (HOSPITAL_BASED_OUTPATIENT_CLINIC_OR_DEPARTMENT_OTHER): Payer: 59 | Admitting: Oncology

## 2024-01-22 VITALS — BP 116/65 | HR 105 | Temp 97.6°F | Resp 18

## 2024-01-22 DIAGNOSIS — D508 Other iron deficiency anemias: Secondary | ICD-10-CM | POA: Diagnosis not present

## 2024-01-22 DIAGNOSIS — Z7901 Long term (current) use of anticoagulants: Secondary | ICD-10-CM | POA: Diagnosis not present

## 2024-01-22 DIAGNOSIS — D509 Iron deficiency anemia, unspecified: Secondary | ICD-10-CM | POA: Diagnosis not present

## 2024-01-22 DIAGNOSIS — Z86718 Personal history of other venous thrombosis and embolism: Secondary | ICD-10-CM

## 2024-01-22 DIAGNOSIS — Z86711 Personal history of pulmonary embolism: Secondary | ICD-10-CM | POA: Diagnosis not present

## 2024-01-22 DIAGNOSIS — Z79899 Other long term (current) drug therapy: Secondary | ICD-10-CM | POA: Diagnosis not present

## 2024-01-22 MED ORDER — APIXABAN 2.5 MG PO TABS
2.5000 mg | ORAL_TABLET | Freq: Two times a day (BID) | ORAL | 5 refills | Status: DC
  Filled 2024-01-22 – 2024-02-18 (×2): qty 60, 30d supply, fill #0
  Filled 2024-03-15: qty 60, 30d supply, fill #1
  Filled 2024-05-02: qty 60, 30d supply, fill #2
  Filled 2024-06-01: qty 60, 30d supply, fill #3
  Filled 2024-06-28: qty 60, 30d supply, fill #4

## 2024-01-22 NOTE — Assessment & Plan Note (Signed)
Continue Eliquis 2.5 mg twice daily.

## 2024-01-22 NOTE — Progress Notes (Signed)
Hematology/Oncology Progress note [phone] [phone]      CHIEF COMPLAINTS/REASON FOR VISIT:  pulmonary embolism and DVT, iron deficiency anemia.   ASSESSMENT & PLAN:   IDA (iron deficiency anemia) Recommend Vitron C daily, she is currently not taking on a daily basis. Lab Results  Component Value Date   HGB 12.2 01/15/2024   TIBC 424 01/15/2024   IRONPCTSAT 27 01/15/2024   FERRITIN 72 01/15/2024    Hemoglobin and iron panel have both improved. No need for Venofer today  History of DVT (deep vein thrombosis) Continue Eliquis 2.5 mg twice daily.  Orders Placed This Encounter  Procedures   CBC with Differential (Cancer Center Only)    Standing Status:   Future    Expected Date:   07/21/2024    Expiration Date:   01/21/2025   Iron and TIBC    Standing Status:   Future    Expected Date:   07/21/2024    Expiration Date:   01/21/2025   Ferritin    Standing Status:   Future    Expected Date:   07/21/2024    Expiration Date:   01/21/2025   Retic Panel    Standing Status:   Future    Expected Date:   07/21/2024    Expiration Date:   01/21/2025   Follow up in 6 months.  All questions were answered. The patient knows to call the clinic with any problems, questions or concerns.  [name], MD, PhD [facility] Hematology Oncology 01/22/2024    HISTORY OF PRESENTING ILLNESS:   Courtney Hanson is a  49 y.o.  female with PMH listed below was seen in consultation at the request of  Jeani Sow, MD  for evaluation of history of pulmonary embolism and DVT.  Patient has remote history of 2 episodes of lower extremity DVT. Per patient, first episode was in 2003, she developed acute left lower extremity calf vein DVT after gallbladder surgery.  She was treated with 3 months of anticoagulation  04/09/2013, 05/31/2013 bilateral lower extremity venous duplex showed no right extremity DVT.  Abnormalities consistent with the sequela of the prior venous obstructive  process with findings that appear chronic/longstanding in nature and are not identified in the proximal vein.  05/20/2016, second episode of acute left lower extremity DVT, provoked by birth control pills.  Patient was treated with Eliquis for a period of time.  05/04/2020.She developed shortness of breath and chest pain.  Her work-up showed unprovoked pulmonary embolism on  Patient was started on anticoagulation and has been on Eliquis since then.  Currently she is on Eliquis 5 mg twice daily.  She tolerates well.  Denies any active bleeding events.  Patient has had hypercoagulable work-up done in the past.  Negative factor V Leiden mutation, prothrombin gene mutation. She had  adequate Antithrombin 3 level, protein C and S activity. She was seen by hematology at [facility] on 10/08/2020.  Antiphospholipid panel was obtained by hematology was negative for lupus anticoagulant, negative anticardiolipin IgM/IgG, negative beta 2 glycoprotein protein IgG/IgM  Patient has chronic varicose veins on her bilateral lower extremity. Patient denies any shortness of breath.  She has experienced left anterior chest wall pain which she attributes to " pulled muscle".  Patient has family history of breast cancer in sister.  INTERVAL HISTORY MARGEE Hanson is a 48 y.o. female who has above history reviewed by me today presents for follow up visit for history of DVT/PE Patient takes eliquis 2.5mg  BID,  tolerates well. She denies any bleeding events, chest pain, SOB, leg edema more than her baseline level.  She tolerated IV venofer  Review of Systems  Constitutional:  Negative for appetite change, chills, fatigue and fever.  HENT:   Negative for hearing loss and voice change.   Eyes:  Negative for eye problems.  Respiratory:  Negative for chest tightness and cough.   Cardiovascular:  Negative for chest pain.  Gastrointestinal:  Negative for abdominal distention, abdominal pain and blood in stool.  Endocrine: Negative  for hot flashes.  Genitourinary:  Negative for difficulty urinating and frequency.   Musculoskeletal:  Negative for arthralgias.  Skin:  Negative for itching and rash.  Neurological:  Negative for extremity weakness.  Hematological:  Negative for adenopathy.  Psychiatric/Behavioral:  Negative for confusion.     MEDICAL HISTORY:  Past Medical History:  Diagnosis Date   Anxiety    GERD (gastroesophageal reflux disease)    Lower extremity edema    Pulmonary embolism (HCC)     SURGICAL HISTORY: Past Surgical History:  Procedure Laterality Date   CHOLECYSTECTOMY, LAPAROSCOPIC N/A 2003   COLONOSCOPY WITH PROPOFOL N/A 10/17/2021   Procedure: COLONOSCOPY WITH PROPOFOL;  Surgeon: Toney Reil, MD;  Location: [facility] ENDOSCOPY;  Service: Gastroenterology;  Laterality: N/A;   DILITATION & CURRETTAGE/HYSTROSCOPY WITH ESSURE     ESOPHAGOGASTRODUODENOSCOPY N/A 10/17/2021   Procedure: ESOPHAGOGASTRODUODENOSCOPY (EGD);  Surgeon: Toney Reil, MD;  Location[facility] ENDOSCOPY;  Service: Gastroenterology;  Laterality: N/A;   LEEP N/A 2003    SOCIAL HISTORY: Social History   Socioeconomic History   Marital status: Single    Spouse name: Not on file   Number of children: 4   Years of education: Not on file   Highest education level: Associate degree: academic program  Occupational History   Not on file  Tobacco Use   Smoking status: Never   Smokeless tobacco: Never  Vaping Use   Vaping status: Never Used  Substance and Sexual Activity   Alcohol use: Not Currently   Drug use: Never   Sexual activity: Yes    Birth control/protection: Surgical  Other Topics Concern   Not on file  Social History Narrative   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 [employer]   Social Drivers of Health   Financial Resource Strain: Low Risk  (12/10/2023)   Overall Financial Resource Strain (CARDIA)    Difficulty of Paying Living Expenses: Not hard at all  Food Insecurity: No Food  Insecurity (12/10/2023)   Hunger Vital Sign    Worried About Running Out of Food in the Last Year: Never true    Ran Out of Food in the Last Year: Never true  Transportation Needs: No Transportation Needs (12/10/2023)   PRAPARE - Administrator, Civil Service (Medical): No    Lack of Transportation (Non-Medical): No  Physical Activity: Unknown (12/10/2023)   Exercise Vital Sign    Days of Exercise per Week: 0 days    Minutes of Exercise per Session: Not on file  Stress: Stress Concern Present (12/10/2023)   [employer] of Occupational Health - Occupational Stress Questionnaire    Feeling of Stress : To some extent  Social Connections: Moderately Isolated (12/10/2023)   Social Connection and Isolation Panel [NHANES]    Frequency of Communication with Friends and Family: Three times a week    Frequency of Social Gatherings with Friends and Family: Once a week    Attends Religious Services: More than 4 times per  year    Active Member of Clubs or Organizations: No    Attends Banker Meetings: Not on file    Marital Status: Never married  Intimate Partner Violence: Unknown (03/12/2022)   Received from [employer], [facility]   HITS    Physically Hurt: Not on file    Insult or Talk Down To: Not on file    Threaten Physical Harm: Not on file    Scream or Curse: Not on file    FAMILY HISTORY: Family History  Problem Relation Age of Onset   Hyperlipidemia Mother    Arthritis Mother    Hypertension Mother    Diabetes Mother    Hyperlipidemia Father    Alcohol abuse Father    Arthritis Father    Hypertension Father    Breast cancer Sister 30   Breast cancer Paternal Aunt    Stomach cancer Paternal Uncle    Cancer Paternal Uncle        unk type   Alcohol abuse Maternal Grandfather    Diabetes Paternal Grandmother    Throat cancer Paternal Grandfather        dx. 50s   Birth defects Paternal Grandfather    Alcohol abuse Paternal Grandfather      ALLERGIES:  is allergic to tetanus toxoid, topiramate, venlafaxine, and latex.  MEDICATIONS:  Current Outpatient Medications  Medication Sig Dispense Refill   albuterol (VENTOLIN HFA) 108 (90 Base) MCG/ACT inhaler Inhale 1-2 puffs into the lungs every 6 (six) hours as needed. 6.7 g 0   ALPRAZolam (XANAX) 0.5 MG tablet Take 1 tablet (0.5 mg total) by mouth 2 (two) times daily as needed. 30 tablet 1   buPROPion (WELLBUTRIN XL) 150 MG 24 hr tablet Take 1 tablet (150 mg total) by mouth daily. 30 tablet 1   cyclobenzaprine (FLEXERIL) 5 MG tablet Take by mouth as needed.     desvenlafaxine (PRISTIQ) 100 MG 24 hr tablet Take 1 tablet (100 mg total) by mouth daily. 90 tablet 0   gabapentin (NEURONTIN) 300 MG capsule Take 1 capsule (300 mg total) by mouth at bedtime. 30 capsule 1   meloxicam (MOBIC) 15 MG tablet Take 1 tablet (15 mg total) by mouth daily. 30 tablet 1   apixaban (ELIQUIS) 2.5 MG TABS tablet Take 1 tablet (2.5 mg total) by mouth 2 (two) times daily. 60 tablet 5   esomeprazole (NEXIUM) 40 MG capsule Take 1 capsule (40 mg total) by mouth daily before breakfast. (Patient not taking: Reported on 01/22/2024) 90 capsule 0   furosemide (LASIX) 20 MG tablet Take 1 tablet (20 mg total) by mouth as needed. (Patient not taking: Reported on 01/22/2024) 30 tablet 3   hydrOXYzine (VISTARIL) 25 MG capsule Take 1 capsule (25 mg total) by mouth every 8 (eight) hours as needed. (Patient not taking: Reported on 01/22/2024) 30 capsule 3   potassium chloride (MICRO-K) 10 MEQ CR capsule Take 2 capsules (20 mEq total) by mouth as needed. (Patient not taking: Reported on 01/22/2024) 30 capsule 3   promethazine (PHENERGAN) 25 MG tablet Take 1 tablet (25 mg total) by mouth every 6 (six) hours as needed for nausea (Patient not taking: Reported on 01/22/2024) 30 tablet 2   Ubrogepant (UBRELVY) 50 MG TABS Take 1 tablet (50 mg total) by mouth daily as needed. (Patient not taking: Reported on 01/22/2024) 16 tablet 2    zolpidem (AMBIEN CR) 6.25 MG CR tablet Take 1 tablet (6.25 mg total) by mouth at bedtime as needed for  sleep. (Patient not taking: Reported on 01/22/2024) 30 tablet 1   No current facility-administered medications for this visit.     PHYSICAL EXAMINATION: Today's Vitals   01/22/24 1148  BP: 116/65  Pulse: (!) 105  Resp: 18  Temp: 97.6 F (36.4 C)  TempSrc: Tympanic  SpO2: 97%  PainSc: 0-No pain    Physical Exam Constitutional:      General: She is not in acute distress.    Appearance: She is obese.  HENT:     Head: Normocephalic and atraumatic.  Eyes:     General: No scleral icterus. Cardiovascular:     Rate and Rhythm: Normal rate.  Pulmonary:     Effort: Pulmonary effort is normal. No respiratory distress.  Abdominal:     General: There is no distension.  Musculoskeletal:        General: Normal range of motion.     Cervical back: Normal range of motion and neck supple.     Comments: Right lower extremity varicose vein  Skin:    Findings: No rash.  Neurological:     Mental Status: She is alert and oriented to person, place, and time. Mental status is at baseline.  Psychiatric:        Mood and Affect: Mood normal.     LABORATORY DATA:  I have reviewed the data as listed Lab Results  Component Value Date   WBC 5.4 01/15/2024   HGB 12.2 01/15/2024   HCT 39.4 01/15/2024   MCV 88.3 01/15/2024   PLT 228 01/15/2024   Recent Labs    04/17/23 1205 08/14/23 1208 09/11/23 1039  NA 136 139 138  K 3.8 4.2 4.2  CL 102 102 105  CO2 23 31 26   GLUCOSE 109* 89 104*  BUN 13 12 7   CREATININE 0.74 0.62 0.65  CALCIUM 8.8* 9.2 8.9  GFRNONAA >60  --  >60  PROT 7.8 6.9 7.5  ALBUMIN 3.7 3.7 3.7  AST 22 13 18   ALT 16 15 20   ALKPHOS 45 47 50  BILITOT 1.1 0.8 0.9   Iron/TIBC/Ferritin/ %Sat    Component Value Date/Time   IRON 115 01/15/2024 1528   TIBC 424 01/15/2024 1528   FERRITIN 72 01/15/2024 1528   IRONPCTSAT 27 01/15/2024 1528      RADIOGRAPHIC  STUDIES: I have personally reviewed the radiological images as listed and agreed with the findings in the report. No results found.

## 2024-02-01 ENCOUNTER — Encounter: Payer: Self-pay | Admitting: Oncology

## 2024-02-16 ENCOUNTER — Encounter: Payer: Self-pay | Admitting: Oncology

## 2024-02-18 NOTE — Progress Notes (Deleted)
    [facility] [address] [place] [address] Phone: [phone]   Assessment and Plan:     There are no diagnoses linked to this encounter.  ***   Pertinent previous records reviewed include ***    Follow Up: ***     Subjective:   I, Courtney Hanson, am serving as a Neurosurgeon for Doctor Courtney Hanson  Chief Complaint: elbow pain   HPI:   02/19/2024 Patient is a 48 year old female with elbow pain. Patient states   Relevant Historical Information: ***  Additional pertinent review of systems negative.   Current Outpatient Medications:    albuterol (VENTOLIN HFA) 108 (90 Base) MCG/ACT inhaler, Inhale 1-2 puffs into the lungs every 6 (six) hours as needed., Disp: 6.7 g, Rfl: 0   ALPRAZolam (XANAX) 0.5 MG tablet, Take 1 tablet (0.5 mg total) by mouth 2 (two) times daily as needed., Disp: 30 tablet, Rfl: 1   apixaban (ELIQUIS) 2.5 MG TABS tablet, Take 1 tablet (2.5 mg total) by mouth 2 (two) times daily., Disp: 60 tablet, Rfl: 5   buPROPion (WELLBUTRIN XL) 150 MG 24 hr tablet, Take 1 tablet (150 mg total) by mouth daily., Disp: 30 tablet, Rfl: 1   cyclobenzaprine (FLEXERIL) 5 MG tablet, Take by mouth as needed., Disp: , Rfl:    desvenlafaxine (PRISTIQ) 100 MG 24 hr tablet, Take 1 tablet (100 mg total) by mouth daily., Disp: 90 tablet, Rfl: 0   esomeprazole (NEXIUM) 40 MG capsule, Take 1 capsule (40 mg total) by mouth daily before breakfast. (Patient not taking: Reported on 01/22/2024), Disp: 90 capsule, Rfl: 0   furosemide (LASIX) 20 MG tablet, Take 1 tablet (20 mg total) by mouth as needed. (Patient not taking: Reported on 01/22/2024), Disp: 30 tablet, Rfl: 3   gabapentin (NEURONTIN) 300 MG capsule, Take 1 capsule (300 mg total) by mouth at bedtime., Disp: 30 capsule, Rfl: 1   hydrOXYzine (VISTARIL) 25 MG capsule, Take 1 capsule (25 mg total) by mouth every 8 (eight) hours as needed. (Patient not taking: Reported on 01/22/2024),  Disp: 30 capsule, Rfl: 3   meloxicam (MOBIC) 15 MG tablet, Take 1 tablet (15 mg total) by mouth daily., Disp: 30 tablet, Rfl: 1   potassium chloride (MICRO-K) 10 MEQ CR capsule, Take 2 capsules (20 mEq total) by mouth as needed. (Patient not taking: Reported on 01/22/2024), Disp: 30 capsule, Rfl: 3   promethazine (PHENERGAN) 25 MG tablet, Take 1 tablet (25 mg total) by mouth every 6 (six) hours as needed for nausea (Patient not taking: Reported on 01/22/2024), Disp: 30 tablet, Rfl: 2   Ubrogepant (UBRELVY) 50 MG TABS, Take 1 tablet (50 mg total) by mouth daily as needed. (Patient not taking: Reported on 01/22/2024), Disp: 16 tablet, Rfl: 2   zolpidem (AMBIEN CR) 6.25 MG CR tablet, Take 1 tablet (6.25 mg total) by mouth at bedtime as needed for sleep. (Patient not taking: Reported on 01/22/2024), Disp: 30 tablet, Rfl: 1   Objective:     There were no vitals filed for this visit.    There is no height or weight on file to calculate BMI.    Physical Exam:    ***   Electronically signed by:  [facility] 7:47 AM 02/18/24

## 2024-02-19 ENCOUNTER — Other Ambulatory Visit: Payer: Self-pay

## 2024-02-19 ENCOUNTER — Ambulatory Visit: Admitting: Sports Medicine

## 2024-02-23 ENCOUNTER — Encounter: Payer: Self-pay | Admitting: Family Medicine

## 2024-02-25 ENCOUNTER — Ambulatory Visit: Payer: 59 | Admitting: Gastroenterology

## 2024-02-25 NOTE — Progress Notes (Unsigned)
   Rubin Payor, PhD, LAT, ATC acting as a scribe for Clementeen Graham, MD.  Courtney Hanson is a 48 y.o. female who presents to [facility] at [facility] today for thumb pain??***. Pt locates pain to ***  Aggravates: Treatments tried:  Pertinent review of systems: ***  Relevant historical information: ***   Exam:  There were no vitals taken for this visit. General: Well Developed, well nourished, and in no acute distress.   MSK: ***    Lab and Radiology Results No results found for this or any previous visit (from the past 72 hours). No results found.     Assessment and Plan: 48 y.o. female with ***   PDMP not reviewed this encounter. No orders of the defined types were placed in this encounter.  No orders of the defined types were placed in this encounter.    Discussed warning signs or symptoms. Please see discharge instructions. Patient expresses understanding.   ***

## 2024-02-26 ENCOUNTER — Other Ambulatory Visit: Payer: Self-pay

## 2024-02-26 ENCOUNTER — Ambulatory Visit: Admitting: Family Medicine

## 2024-02-26 ENCOUNTER — Encounter: Payer: Self-pay | Admitting: Family Medicine

## 2024-02-26 VITALS — BP 120/82 | HR 105 | Ht 61.0 in

## 2024-02-26 DIAGNOSIS — M7712 Lateral epicondylitis, left elbow: Secondary | ICD-10-CM | POA: Diagnosis not present

## 2024-02-26 DIAGNOSIS — M65311 Trigger thumb, right thumb: Secondary | ICD-10-CM

## 2024-02-26 NOTE — Patient Instructions (Addendum)
 Thank you for coming in today.   Call or go to the ER if you develop a large red swollen joint with extreme pain or oozing puss.    Use the double bandaid splint  Recheck as needed.  Tennis elbow.   Work the [employer] and strength exercises.   1-2 pound hand weight go slow. 1-3x daily abut 10-20 reps.   Tennis elbow brace.   If not getting better let me know and I will refer to hand therapy.

## 2024-03-15 ENCOUNTER — Other Ambulatory Visit (HOSPITAL_COMMUNITY): Payer: Self-pay

## 2024-03-15 ENCOUNTER — Telehealth: Payer: Self-pay

## 2024-03-15 NOTE — Telephone Encounter (Signed)
 Pharmacy Patient Advocate Encounter   Received notification from CoverMyMeds that prior authorization for Ubrelvy 50MG  tablets is required/requested.   Insurance verification completed.   The patient is insured through [facility] .   Per test claim: PA required; PA submitted to above mentioned insurance via CoverMyMeds Key/confirmation #/EOC [id number] Status is pending

## 2024-03-17 ENCOUNTER — Encounter: Payer: Self-pay | Admitting: *Deleted

## 2024-03-17 ENCOUNTER — Ambulatory Visit
Admission: EM | Admit: 2024-03-17 | Discharge: 2024-03-17 | Disposition: A | Discharge status: Home / Self Care | Attending: Physician Assistant | Admitting: Physician Assistant

## 2024-03-17 ENCOUNTER — Other Ambulatory Visit: Payer: Self-pay

## 2024-03-17 DIAGNOSIS — Z113 Encounter for screening for infections with a predominantly sexual mode of transmission: Secondary | ICD-10-CM | POA: Insufficient documentation

## 2024-03-17 NOTE — Discharge Instructions (Addendum)
 You were seen today for STD screening.  We collected a cervicovaginal swab that we will assess for gonorrhea, chlamydia, trichomonas, bacterial vaginosis, yeast.  If collected blood work that we will assess for HIV and syphilis.  We will keep you updated with these results once they are available.  If any medications are indicated by those test results we will call you and medications will either be sent to the pharmacy on file or you can return to the urgent care for an injection.  It is recommended that you refrain from sexual activity until your test results are negative or until you have completed an appropriate medication regimen as dictated by your test results.  Please use a condom or another barrier method to help prevent STD transmission.  Please make sure that you communicate with your partners regarding your test results should any positive results, about as they will also need to be tested and screened.

## 2024-03-17 NOTE — ED Provider Notes (Signed)
 [facility]    CSN: [id number] Arrival date & time: 03/17/24  1819      History   Chief Complaint Chief Complaint  Patient presents with   SEXUALLY TRANSMITTED DISEASE    HPI Courtney Hanson is a 48 y.o. female.   HPI  She reports she would like preventative STD testing today  She denies current symptoms of changes to vaginal discharge, vaginal pain or bleeding, nausea, abdominal pain, fever, or chills She reports she is sexually active with more than one female partner She denies her partners expressing concerns for STD symptoms    Past Medical History:  Diagnosis Date   Anxiety    GERD (gastroesophageal reflux disease)    Lower extremity edema    Pulmonary embolism (HCC)     Patient Active Problem List   Diagnosis Date Noted   Paresthesia of arm 12/18/2023   Migraine without aura and without status migrainosus, not intractable 08/15/2023   History of DVT (deep vein thrombosis) 09/13/2022   IDA (iron deficiency anemia) 09/13/2022   Genetic testing 03/18/2022   History of pulmonary embolism 02/25/2022   Family history of breast cancer 02/25/2022   History of DVT of lower extremity 02/25/2022   Pulmonary embolism (HCC) 02/11/2022   Primary insomnia 02/11/2022   Localized edema 02/11/2022   Chronic diarrhea    Adenomatous rectal polyp    Nausea without vomiting    Chronic GERD    Erosive gastritis    Prediabetes 10/18/2020   Chronic right-sided thoracic back pain 09/23/2019   Vitamin D deficiency 06/04/2017   Anxiety and depression 06/19/2016   Tachycardia 06/19/2016   Major depressive disorder, recurrent episode, moderate (HCC) 07/13/2015   PMDD (premenstrual dysphoric disorder) 12/21/2013   GAD (generalized anxiety disorder) 06/24/2013   Morbid obesity with BMI of 60.0-69.9, adult (HCC) 06/24/2013   Endometritis 05/31/2013    Past Surgical History:  Procedure Laterality Date   CHOLECYSTECTOMY, LAPAROSCOPIC N/A 2003   COLONOSCOPY WITH  PROPOFOL N/A 10/17/2021   Procedure: COLONOSCOPY WITH PROPOFOL;  Surgeon: Toney Reil, MD;  Location: [facility] ENDOSCOPY;  Service: Gastroenterology;  Laterality: N/A;   DILITATION & CURRETTAGE/HYSTROSCOPY WITH ESSURE     ESOPHAGOGASTRODUODENOSCOPY N/A 10/17/2021   Procedure: ESOPHAGOGASTRODUODENOSCOPY (EGD);  Surgeon: Toney Reil, MD;  Location: [facility] ENDOSCOPY;  Service: Gastroenterology;  Laterality: N/A;   LEEP N/A 2003    OB History   No obstetric history on file.      Home Medications    Prior to Admission medications   Medication Sig Start Date End Date Taking? Authorizing Provider  albuterol (VENTOLIN HFA) 108 (90 Base) MCG/ACT inhaler Inhale 1-2 puffs into the lungs every 6 (six) hours as needed. 11/16/23   Radford Pax, NP  ALPRAZolam Prudy Feeler) 0.5 MG tablet Take 1 tablet (0.5 mg total) by mouth 2 (two) times daily as needed. 08/14/23   Jeani Sow, MD  apixaban (ELIQUIS) 2.5 MG TABS tablet Take 1 tablet (2.5 mg total) by mouth 2 (two) times daily. 01/22/24   [name], MD  buPROPion (WELLBUTRIN XL) 150 MG 24 hr tablet Take 1 tablet (150 mg total) by mouth daily. 01/01/24   Jeani Sow, MD  cyclobenzaprine (FLEXERIL) 5 MG tablet Take by mouth as needed.    [provider]  desvenlafaxine (PRISTIQ) 100 MG 24 hr tablet Take 1 tablet (100 mg total) by mouth daily. 01/13/24   Jeani Sow, MD  esomeprazole (NEXIUM) 40 MG capsule Take 1 capsule (40 mg total)  by mouth daily before breakfast. 05/25/23   Jeani Sow, MD  furosemide (LASIX) 20 MG tablet Take 1 tablet (20 mg total) by mouth as needed. 02/11/22   Jeani Sow, MD  gabapentin (NEURONTIN) 300 MG capsule Take 1 capsule (300 mg total) by mouth at bedtime. 12/18/23   Jerrol Banana, MD  hydrOXYzine (VISTARIL) 25 MG capsule Take 1 capsule (25 mg total) by mouth every 8 (eight) hours as needed. 08/14/23   Jeani Sow, MD  meloxicam (MOBIC) 15 MG tablet Take 1 tablet (15 mg total) by mouth  daily. 12/18/23   Jerrol Banana, MD  potassium chloride (MICRO-K) 10 MEQ CR capsule Take 2 capsules (20 mEq total) by mouth as needed. 02/11/22   Jeani Sow, MD  promethazine (PHENERGAN) 25 MG tablet Take 1 tablet (25 mg total) by mouth every 6 (six) hours as needed for nausea 08/14/23   Jeani Sow, MD  Ubrogepant (UBRELVY) 50 MG TABS Take 1 tablet (50 mg total) by mouth daily as needed. 09/30/23   Jeani Sow, MD  zolpidem (AMBIEN CR) 6.25 MG CR tablet Take 1 tablet (6.25 mg total) by mouth at bedtime as needed for sleep. Patient not taking: Reported on 01/22/2024 08/14/23 02/10/24  Jeani Sow, MD    Family History Family History  Problem Relation Age of Onset   Hyperlipidemia Mother    Arthritis Mother    Hypertension Mother    Diabetes Mother    Hyperlipidemia Father    Alcohol abuse Father    Arthritis Father    Hypertension Father    Breast cancer Sister 15   Breast cancer Paternal Aunt    Stomach cancer Paternal Uncle    Cancer Paternal Uncle        unk type   Alcohol abuse Maternal Grandfather    Diabetes Paternal Grandmother    Throat cancer Paternal Grandfather        dx. 38s   Birth defects Paternal Grandfather    Alcohol abuse Paternal Grandfather     Social History Social History   Tobacco Use   Smoking status: Never   Smokeless tobacco: Never  Vaping Use   Vaping status: Never Used  Substance Use Topics   Alcohol use: Not Currently   Drug use: Never     Allergies   Tetanus toxoid, Topiramate, Venlafaxine, and Latex   Review of Systems Review of Systems  Constitutional:  Negative for chills, fatigue and fever.  Gastrointestinal:  Negative for abdominal pain, nausea and vomiting.  Genitourinary:  Negative for decreased urine volume, difficulty urinating, dysuria, genital sores, pelvic pain, vaginal bleeding, vaginal discharge and vaginal pain.  Skin:  Negative for rash.     Physical Exam Triage Vital Signs ED Triage Vitals  [03/17/24 1826]  Encounter Vitals Group     BP (!) 141/90     Systolic BP Percentile      Diastolic BP Percentile      Pulse Rate (!) 105     Resp 18     Temp 97.9 F (36.6 C)     Temp Source Oral     SpO2 95 %     Weight      Height      Head Circumference      Peak Flow      Pain Score      Pain Loc      Pain Education      Exclude from Growth Chart  No data found.  Updated Vital Signs BP (!) 141/90 (BP Location: Right Arm)   Pulse (!) 105   Temp 97.9 F (36.6 C) (Oral)   Resp 18   Ht 5\' 1"  (1.549 m)   Wt (!) 338 lb 3 oz (153.4 kg)   LMP 03/01/2024 (Exact Date)   SpO2 95%   BMI 63.90 kg/m   Visual Acuity Right Eye Distance:   Left Eye Distance:   Bilateral Distance:    Right Eye Near:   Left Eye Near:    Bilateral Near:     Physical Exam Vitals reviewed.  Constitutional:      General: She is awake.     Appearance: Normal appearance. She is well-developed and well-groomed.  HENT:     Head: Normocephalic and atraumatic.  Eyes:     General: Lids are normal. Gaze aligned appropriately.     Extraocular Movements: Extraocular movements intact.     Conjunctiva/sclera: Conjunctivae normal.  Pulmonary:     Effort: Pulmonary effort is normal.  Neurological:     General: No focal deficit present.     Mental Status: She is alert and oriented to person, place, and time.     GCS: GCS eye subscore is 4. GCS verbal subscore is 5. GCS motor subscore is 6.     Cranial Nerves: No cranial nerve deficit, dysarthria or facial asymmetry.  Psychiatric:        Attention and Perception: Attention and perception normal.        Mood and Affect: Mood and affect normal.        Speech: Speech normal.        Behavior: Behavior normal. Behavior is cooperative.        Thought Content: Thought content normal.      UC Treatments / Results  Labs (all labs ordered are listed, but only abnormal results are displayed) Labs Reviewed  RPR  HIV ANTIBODY (ROUTINE TESTING W  REFLEX)  CERVICOVAGINAL ANCILLARY ONLY    EKG   Radiology No results found.  Procedures Procedures (including critical care time)  Medications Ordered in UC Medications - No data to display  Initial Impression / Assessment and Plan / UC Course  I have reviewed the triage vital signs and the nursing notes.  Pertinent labs & imaging results that were available during my care of the patient were reviewed by me and considered in my medical decision making (see chart for details).      Final Clinical Impressions(s) / UC Diagnoses   Final diagnoses:  Screening examination for STD (sexually transmitted disease)   Patient presents today for STD screening.  She denies current symptoms and reports that her partners have not expressed concerns for potential STD infection either.  Cervicovaginal swab collected to assess for gonorrhea, chlamydia, trichomonas, yeast, BV.  Blood work collected to assess for HIV and syphilis.  Reviewed with patient that results typically take 1 to 3 days to return and she will be kept updated with those test results once they are available.  If any medications are indicated by those test results they will be called into the pharmacy on file or she can return to urgent care for appropriate injection.  Recommend refraining from sexual activity until test results are negative or until she has completed an appropriate medication regimen.  Recommend using a condom or an appropriate barrier method to help prevent STD transmission and communication with partners should any of her test results reveal positive outcome that would require  partner testing and treatment.    Discharge Instructions      You were seen today for STD screening.  We collected a cervicovaginal swab that we will assess for gonorrhea, chlamydia, trichomonas, bacterial vaginosis, yeast.  If collected blood work that we will assess for HIV and syphilis.  We will keep you updated with these results once  they are available.  If any medications are indicated by those test results we will call you and medications will either be sent to the pharmacy on file or you can return to the urgent care for an injection.  It is recommended that you refrain from sexual activity until your test results are negative or until you have completed an appropriate medication regimen as dictated by your test results.  Please use a condom or another barrier method to help prevent STD transmission.  Please make sure that you communicate with your partners regarding your test results should any positive results, about as they will also need to be tested and screened.     ED Prescriptions   None    PDMP not reviewed this encounter.   Providence Crosby, PA-C 03/17/24 1932

## 2024-03-17 NOTE — Telephone Encounter (Signed)
 Pharmacy Patient Advocate Encounter  Received notification from [facility] that Prior Authorization for Ubrelvy 50mg   has been DENIED.  Full denial letter will be uploaded to the media tab. See denial reason below.   PA #/Case ID/Reference #: [id number]

## 2024-03-17 NOTE — Telephone Encounter (Signed)
 Patient notified of message below.

## 2024-03-17 NOTE — ED Triage Notes (Signed)
 Pt presents to urgent care for STD testing. Pt is requesting vaginal swab and blood work to be completed this visit. Pt currently denies symptoms or pain at this time.

## 2024-03-18 LAB — HIV ANTIBODY (ROUTINE TESTING W REFLEX): HIV Screen 4th Generation wRfx: NONREACTIVE

## 2024-03-18 LAB — RPR: RPR Ser Ql: NONREACTIVE

## 2024-03-22 ENCOUNTER — Other Ambulatory Visit: Payer: Self-pay

## 2024-03-22 ENCOUNTER — Encounter: Payer: Self-pay | Admitting: Oncology

## 2024-03-22 ENCOUNTER — Telehealth (HOSPITAL_COMMUNITY): Payer: Self-pay

## 2024-03-22 LAB — CERVICOVAGINAL ANCILLARY ONLY
Bacterial Vaginitis (gardnerella): POSITIVE — AB
Candida Glabrata: NEGATIVE
Candida Vaginitis: NEGATIVE
Chlamydia: NEGATIVE
Comment: NEGATIVE
Comment: NEGATIVE
Comment: NEGATIVE
Comment: NEGATIVE
Comment: NEGATIVE
Comment: NORMAL
Neisseria Gonorrhea: NEGATIVE
Trichomonas: POSITIVE — AB

## 2024-03-22 MED ORDER — METRONIDAZOLE 500 MG PO TABS
500.0000 mg | ORAL_TABLET | Freq: Two times a day (BID) | ORAL | 0 refills | Status: AC
  Filled 2024-03-22: qty 14, 7d supply, fill #0

## 2024-03-22 NOTE — Telephone Encounter (Signed)
 Per protocol, pt requires tx with metronidazole. Attempted to reach patient x1. LVM.  Rx sent to pharmacy on file.

## 2024-03-31 ENCOUNTER — Other Ambulatory Visit: Payer: Self-pay

## 2024-03-31 ENCOUNTER — Other Ambulatory Visit: Payer: Self-pay | Admitting: Family Medicine

## 2024-03-31 ENCOUNTER — Telehealth (HOSPITAL_COMMUNITY): Payer: Self-pay

## 2024-03-31 MED ORDER — DESVENLAFAXINE SUCCINATE ER 100 MG PO TB24
100.0000 mg | ORAL_TABLET | Freq: Every day | ORAL | 0 refills | Status: DC
  Filled 2024-03-31: qty 90, 90d supply, fill #0

## 2024-03-31 MED ORDER — FLUCONAZOLE 150 MG PO TABS
150.0000 mg | ORAL_TABLET | Freq: Once | ORAL | 0 refills | Status: AC
  Filled 2024-03-31: qty 2, 2d supply, fill #0

## 2024-03-31 NOTE — Telephone Encounter (Signed)
 Pt requested Diflucan  for abx-associated yeast infection from recent treatment.  Reviewed with patient, verified pharmacy, prescription sent

## 2024-04-01 ENCOUNTER — Other Ambulatory Visit: Payer: Self-pay

## 2024-04-29 ENCOUNTER — Encounter: Payer: Self-pay | Admitting: Family Medicine

## 2024-05-03 ENCOUNTER — Other Ambulatory Visit: Payer: Self-pay

## 2024-05-03 NOTE — Telephone Encounter (Signed)
 Scheduled

## 2024-05-03 NOTE — Telephone Encounter (Signed)
 Pt needs visit with Dr. Alease Hunter for re-eval. Please reach out to assist with scheduling (possible injection for the elbow).

## 2024-05-06 ENCOUNTER — Ambulatory Visit: Admitting: Family Medicine

## 2024-05-06 ENCOUNTER — Other Ambulatory Visit: Payer: Self-pay

## 2024-05-06 VITALS — BP 110/60 | HR 99 | Ht 61.0 in

## 2024-05-06 DIAGNOSIS — G5632 Lesion of radial nerve, left upper limb: Secondary | ICD-10-CM

## 2024-05-06 NOTE — Patient Instructions (Signed)
 Thank you for coming in today. Injected elbow today.

## 2024-05-06 NOTE — Progress Notes (Signed)
   I, Leone Ralphs am a scribe for Dr. Garlan Juniper, MD.  Courtney [facility]on is a 48 y.o. female who presents to [facility] at [facility] today for cont'd L elbow pain. Pt was last seen by Dr. Alease Hunter on 02/26/24 and was taught HEP.   Today, pt reports that it isn't hurting as it has been before. Thinks that it is more radial pain than tennis elbow pain.  She notes pain is located in the forearm and is like a toothache.  Pain is worse with arm activity.  Pertinent review of systems: No fevers or chills  Relevant historical information: History of a pulmonary embolism.   Exam:  BP 110/60   Pulse 99   Ht 5\' 1"  (1.549 m)   SpO2 96%   BMI 63.90 kg/m  General: Well Developed, well nourished, and in no acute distress.   MSK: Left elbow and forearm normal-appearing Nontender palpation at lateral epicondyle.  Tender palpation dorsal forearm.  Strength is intact distally.    Lab and Radiology Results  Procedure: Real-time Ultrasound Guided Injection of left forearm radial tunnel Device: Philips Affiniti 50G/GE Logiq Images permanently stored and available for review in PACS Verbal informed consent obtained.  Discussed risks and benefits of procedure. Warned about infection, bleeding, hyperglycemia damage to structures among others. Patient expresses understanding and agreement Time-out conducted.   Noted no overlying erythema, induration, or other signs of local infection.   Skin prepped in a sterile fashion.   Local anesthesia: Topical Ethyl chloride.   With sterile technique and under real time ultrasound guidance: 40 mg of Kenalog and 2 mL of lidocaine  injected into soft tissue around the dorsal interosseous nerve. Fluid seen entering the soft tissue.   Completed without difficulty   Pain immediately resolved suggesting accurate placement of the medication.   Advised to call if fevers/chills, erythema, induration, drainage, or persistent bleeding.   Images  permanently stored and available for review in the ultrasound unit.  Impression: Technically successful ultrasound guided injection.        Assessment and Plan: 48 y.o. female with left forearm pain due to radial tunnel syndrome.  Patient had pretty good response to the injection.  Hopefully she will have some lasting benefit.  Recheck as needed.   PDMP not reviewed this encounter. Orders Placed This Encounter  Procedures   US  LIMITED JOINT SPACE STRUCTURES UP LEFT(NO LINKED CHARGES)    Reason for Exam (SYMPTOM  OR DIAGNOSIS REQUIRED):   elbow pain    Preferred imaging location?:   [facility]   No orders of the defined types were placed in this encounter.    Discussed warning signs or symptoms. Please see discharge instructions. Patient expresses understanding.   The above documentation has been reviewed and is accurate and complete Garlan Juniper, M.D.

## 2024-05-25 ENCOUNTER — Ambulatory Visit
Admission: EM | Admit: 2024-05-25 | Discharge: 2024-05-25 | Disposition: A | Discharge status: Home / Self Care | Attending: Nurse Practitioner | Admitting: Nurse Practitioner

## 2024-05-25 ENCOUNTER — Encounter: Payer: Self-pay | Admitting: Emergency Medicine

## 2024-05-25 DIAGNOSIS — Z113 Encounter for screening for infections with a predominantly sexual mode of transmission: Secondary | ICD-10-CM | POA: Diagnosis not present

## 2024-05-25 LAB — POCT URINE PREGNANCY: Preg Test, Ur: NEGATIVE

## 2024-05-25 NOTE — ED Triage Notes (Signed)
 Pt presents requesting STD testing due to having a new sexual partner. Last encounter 2 weeks ago. Pt denies any additional sxs.

## 2024-05-25 NOTE — ED Provider Notes (Signed)
 [name]    CSN: [id number] Arrival date & time: 05/25/24  1803      History   Chief Complaint Chief Complaint  Patient presents with   SEXUALLY TRANSMITTED DISEASE    HPI Courtney Hanson is a 48 y.o. female.   Courtney Hanson is a 48 y.o. female that presents requesting STI testing. She reports having a new sexual partner and desires screening for sexually transmitted infections. The patient denies any current symptoms such as discharge, itching, or irritation. Her last menstrual period started 4 days ago. She reports using condoms for protection, though admits to inconsistent use. The patient is not experiencing any other health concerns at this time and has not reported any recent healthcare interactions or lifestyle changes that may be impacting her health.  The following portions of the patient's history were reviewed and updated as appropriate: allergies, current medications, past family history, past medical history, past social history, past surgical history, and problem list.    Past Medical History:  Diagnosis Date   Anxiety    GERD (gastroesophageal reflux disease)    Lower extremity edema    Pulmonary embolism (HCC)     Patient Active Problem List   Diagnosis Date Noted   Radial tunnel syndrome, left 05/06/2024   Paresthesia of arm 12/18/2023   Migraine without aura and without status migrainosus, not intractable 08/15/2023   History of DVT (deep vein thrombosis) 09/13/2022   IDA (iron  deficiency anemia) 09/13/2022   Genetic testing 03/18/2022   History of pulmonary embolism 02/25/2022   Family history of breast cancer 02/25/2022   History of DVT of lower extremity 02/25/2022   Pulmonary embolism (HCC) 02/11/2022   Primary insomnia 02/11/2022   Localized edema 02/11/2022   Adenomatous rectal polyp 11/15/2021   Nausea without vomiting 11/15/2021   Chronic GERD 11/15/2021   Erosive gastritis 11/15/2021   Chronic diarrhea    Prediabetes  10/18/2020   Chronic right-sided thoracic back pain 09/23/2019   Vitamin D deficiency 06/04/2017   Anxiety and depression 06/19/2016   Tachycardia 06/19/2016   Major depressive disorder, recurrent episode, moderate (HCC) 07/13/2015   PMDD (premenstrual dysphoric disorder) 12/21/2013   GAD (generalized anxiety disorder) 06/24/2013   Morbid obesity with BMI of 60.0-69.9, adult (HCC) 06/24/2013   Endometritis 05/31/2013    Past Surgical History:  Procedure Laterality Date   CHOLECYSTECTOMY, LAPAROSCOPIC N/A 2003   COLONOSCOPY WITH PROPOFOL  N/A 10/17/2021   Procedure: COLONOSCOPY WITH PROPOFOL ;  Surgeon: Selena Daily, MD;  Location: [facility] ENDOSCOPY;  Service: Gastroenterology;  Laterality: N/A;   DILITATION & CURRETTAGE/HYSTROSCOPY WITH ESSURE     ESOPHAGOGASTRODUODENOSCOPY N/A 10/17/2021   Procedure: ESOPHAGOGASTRODUODENOSCOPY (EGD);  Surgeon: Selena Daily, MD;  Location: [facility] ENDOSCOPY;  Service: Gastroenterology;  Laterality: N/A;   LEEP N/A 2003    OB History   No obstetric history on file.      Home Medications    Prior to Admission medications   Medication Sig Start Date End Date Taking? Authorizing Provider  apixaban  (ELIQUIS ) 2.5 MG TABS tablet Take 1 tablet (2.5 mg total) by mouth 2 (two) times daily. 01/22/24  Yes Timmy Forbes, MD  diclofenac Sodium (VOLTAREN) 1 % GEL Apply 2 g topically. 10/18/21  Yes [provider]  albuterol  (VENTOLIN  HFA) 108 (90 Base) MCG/ACT inhaler Inhale 1-2 puffs into the lungs every 6 (six) hours as needed. 11/16/23   Mayer, Jodi R, NP  ALPRAZolam  (XANAX ) 0.5 MG tablet Take 1 tablet (0.5 mg total) by mouth  2 (two) times daily as needed. 08/14/23   Christel Cousins, MD  buPROPion  (WELLBUTRIN  XL) 150 MG 24 hr tablet Take 1 tablet (150 mg total) by mouth daily. 01/01/24   Christel Cousins, MD  cyclobenzaprine (FLEXERIL) 5 MG tablet Take by mouth as needed.    [provider]  desvenlafaxine  (PRISTIQ ) 100 MG 24 hr tablet Take  1 tablet (100 mg total) by mouth daily. 03/31/24   Christel Cousins, MD  esomeprazole  (NEXIUM ) 40 MG capsule Take 1 capsule (40 mg total) by mouth daily before breakfast. 05/25/23   Christel Cousins, MD  furosemide  (LASIX ) 20 MG tablet Take 1 tablet (20 mg total) by mouth as needed. 02/11/22   Christel Cousins, MD  gabapentin  (NEURONTIN ) 300 MG capsule Take 1 capsule (300 mg total) by mouth at bedtime. 12/18/23   Matthews, Jason J, MD  hydrOXYzine  (VISTARIL ) 25 MG capsule Take 1 capsule (25 mg total) by mouth every 8 (eight) hours as needed. 08/14/23   Christel Cousins, MD  potassium chloride  (MICRO-K ) 10 MEQ CR capsule Take 2 capsules (20 mEq total) by mouth as needed. 02/11/22   Christel Cousins, MD  promethazine  (PHENERGAN ) 25 MG tablet Take 1 tablet (25 mg total) by mouth every 6 (six) hours as needed for nausea 08/14/23   Christel Cousins, MD  Ubrogepant  (UBRELVY ) 50 MG TABS Take 1 tablet (50 mg total) by mouth daily as needed. 09/30/23   Christel Cousins, MD  zolpidem  (AMBIEN  CR) 6.25 MG CR tablet Take 1 tablet (6.25 mg total) by mouth at bedtime as needed for sleep. Patient not taking: Reported on 01/22/2024 08/14/23 02/10/24  Christel Cousins, MD    Family History Family History  Problem Relation Age of Onset   Hyperlipidemia Mother    Arthritis Mother    Hypertension Mother    Diabetes Mother    Hyperlipidemia Father    Alcohol abuse Father    Arthritis Father    Hypertension Father    Breast cancer Sister 22   Breast cancer Paternal Aunt    Stomach cancer Paternal Uncle    Cancer Paternal Uncle        unk type   Alcohol abuse Maternal Grandfather    Diabetes Paternal Grandmother    Throat cancer Paternal Grandfather        dx. 73s   Birth defects Paternal Grandfather    Alcohol abuse Paternal Grandfather     Social History Social History   Tobacco Use   Smoking status: Never    Passive exposure: Never   Smokeless tobacco: Never  Vaping Use   Vaping status: Never Used  Substance  Use Topics   Alcohol use: Not Currently   Drug use: Never     Allergies   Tetanus toxoid, Topiramate, Venlafaxine, and Latex   Review of Systems Review of Systems  Genitourinary:  Negative for dysuria, menstrual problem (LMP started 4 days ago) and vaginal discharge.  All other systems reviewed and are negative.    Physical Exam Triage Vital Signs ED Triage Vitals  Encounter Vitals Group     BP 05/25/24 1855 131/82     Girls Systolic BP Percentile --      Girls Diastolic BP Percentile --      Boys Systolic BP Percentile --      Boys Diastolic BP Percentile --      Pulse Rate 05/25/24 1855 99     Resp 05/25/24 1855 20  Temp 05/25/24 1855 98.2 F (36.8 C)     Temp Source 05/25/24 1855 Oral     SpO2 05/25/24 1855 97 %     Weight 05/25/24 1855 (!) 313 lb (142 kg)     Height --      Head Circumference --      Peak Flow --      Pain Score 05/25/24 1853 0     Pain Loc --      Pain Education --      Exclude from Growth Chart --    No data found.  Updated Vital Signs BP 131/82 (BP Location: Left Arm)   Pulse 99   Temp 98.2 F (36.8 C) (Oral)   Resp 20   Wt (!) 313 lb (142 kg)   LMP 05/25/2024 (Exact Date)   SpO2 97%   BMI 59.14 kg/m   Visual Acuity Right Eye Distance:   Left Eye Distance:   Bilateral Distance:    Right Eye Near:   Left Eye Near:    Bilateral Near:     Physical Exam Constitutional:      General: She is not in acute distress.    Appearance: Normal appearance. She is morbidly obese. She is not ill-appearing, toxic-appearing or diaphoretic.  HENT:     Head: Normocephalic.     Nose: Nose normal.     Mouth/Throat:     Mouth: Mucous membranes are moist.   Eyes:     Conjunctiva/sclera: Conjunctivae normal.    Cardiovascular:     Rate and Rhythm: Normal rate.  Pulmonary:     Effort: Pulmonary effort is normal.  Abdominal:     Palpations: Abdomen is soft.  Genitourinary:    Comments: Deferred; patient performed self-swab for Aptima  testing   Musculoskeletal:        General: Normal range of motion.     Cervical back: Normal range of motion and neck supple.   Skin:    General: Skin is warm and dry.   Neurological:     General: No focal deficit present.     Mental Status: She is alert and oriented to person, place, and time.   Psychiatric:        Mood and Affect: Mood normal.        Behavior: Behavior normal.      UC Treatments / Results  Labs (all labs ordered are listed, but only abnormal results are displayed) Labs Reviewed  POCT URINE PREGNANCY  CERVICOVAGINAL ANCILLARY ONLY    EKG   Radiology No results found.  Procedures Procedures (including critical care time)  Medications Ordered in UC Medications - No data to display  Initial Impression / Assessment and Plan / UC Course  I have reviewed the triage vital signs and the nursing notes.  Pertinent labs & imaging results that were available during my care of the patient were reviewed by me and considered in my medical decision making (see chart for details).    The patient presents for routine STI screening and reports a recent new sexual partner. She is currently asymptomatic, with no discharge, itching, or irritation. Her last menstrual period began four days ago. She reports inconsistent condom use. A vaginal swab was collected for gonorrhea, chlamydia, and trichomoniasis testing. The patient was advised to check MyChart for results and to avoid sexual activity until results are available. Follow up with primary care or return to clinic is recommended if symptoms develop or if test results are positive.  Today's  evaluation has revealed no signs of a dangerous process. Discussed diagnosis with patient and/or guardian. Patient and/or guardian aware of their diagnosis, possible red flag symptoms to watch out for and need for close follow up. Patient and/or guardian understands verbal and written discharge instructions. Patient and/or guardian  comfortable with plan and disposition.  Patient and/or guardian has a clear mental status at this time, good insight into illness (after discussion and teaching) and has clear judgment to make decisions regarding their care  Documentation was completed with the aid of voice recognition software. Transcription may contain typographical errors.  Final Clinical Impressions(s) / UC Diagnoses   Final diagnoses:  Screen for STD (sexually transmitted disease)   Discharge Instructions   None    ED Prescriptions   None    PDMP not reviewed this encounter.   Maryruth Sol, [place] 05/25/24 2027

## 2024-05-26 LAB — CERVICOVAGINAL ANCILLARY ONLY
Chlamydia: NEGATIVE
Comment: NEGATIVE
Comment: NEGATIVE
Comment: NORMAL
Neisseria Gonorrhea: NEGATIVE
Trichomonas: NEGATIVE

## 2024-05-27 ENCOUNTER — Ambulatory Visit: Payer: Self-pay | Admitting: Nurse Practitioner

## 2024-06-06 DIAGNOSIS — R079 Chest pain, unspecified: Secondary | ICD-10-CM | POA: Diagnosis not present

## 2024-06-06 DIAGNOSIS — Z5321 Procedure and treatment not carried out due to patient leaving prior to being seen by health care provider: Secondary | ICD-10-CM | POA: Diagnosis not present

## 2024-06-06 DIAGNOSIS — Z7901 Long term (current) use of anticoagulants: Secondary | ICD-10-CM | POA: Diagnosis not present

## 2024-06-06 DIAGNOSIS — Z86718 Personal history of other venous thrombosis and embolism: Secondary | ICD-10-CM | POA: Diagnosis not present

## 2024-06-07 DIAGNOSIS — R079 Chest pain, unspecified: Secondary | ICD-10-CM | POA: Diagnosis not present

## 2024-06-17 ENCOUNTER — Encounter: Payer: No Typology Code available for payment source | Admitting: Family Medicine

## 2024-06-28 ENCOUNTER — Other Ambulatory Visit: Payer: Self-pay | Admitting: Family Medicine

## 2024-06-28 ENCOUNTER — Encounter: Payer: Self-pay | Admitting: Oncology

## 2024-06-28 ENCOUNTER — Other Ambulatory Visit: Payer: Self-pay

## 2024-06-28 MED ORDER — DESVENLAFAXINE SUCCINATE ER 100 MG PO TB24
100.0000 mg | ORAL_TABLET | Freq: Every day | ORAL | 0 refills | Status: DC
  Filled 2024-06-28: qty 90, 90d supply, fill #0

## 2024-07-13 ENCOUNTER — Encounter: Payer: Self-pay | Admitting: Oncology

## 2024-07-22 ENCOUNTER — Inpatient Hospital Stay: Payer: 59 | Attending: Oncology

## 2024-07-22 DIAGNOSIS — Z7901 Long term (current) use of anticoagulants: Secondary | ICD-10-CM | POA: Diagnosis not present

## 2024-07-22 DIAGNOSIS — Z86711 Personal history of pulmonary embolism: Secondary | ICD-10-CM | POA: Diagnosis not present

## 2024-07-22 DIAGNOSIS — Z79899 Other long term (current) drug therapy: Secondary | ICD-10-CM | POA: Diagnosis not present

## 2024-07-22 DIAGNOSIS — D509 Iron deficiency anemia, unspecified: Secondary | ICD-10-CM | POA: Insufficient documentation

## 2024-07-22 DIAGNOSIS — D508 Other iron deficiency anemias: Secondary | ICD-10-CM

## 2024-07-22 DIAGNOSIS — Z86718 Personal history of other venous thrombosis and embolism: Secondary | ICD-10-CM | POA: Insufficient documentation

## 2024-07-22 LAB — RETIC PANEL
Immature Retic Fract: 10 % (ref 2.3–15.9)
RBC.: 4.1 MIL/uL (ref 3.87–5.11)
Retic Count, Absolute: 56.6 K/uL (ref 19.0–186.0)
Retic Ct Pct: 1.4 % (ref 0.4–3.1)
Reticulocyte Hemoglobin: 34.2 pg (ref >27.9)

## 2024-07-22 LAB — CBC WITH DIFFERENTIAL (CANCER CENTER ONLY)
Abs Immature Granulocytes: 0.02 K/uL (ref 0.00–0.07)
Basophils Absolute: 0 K/uL (ref 0.0–0.1)
Basophils Relative: 1 %
Eosinophils Absolute: 0.1 K/uL (ref 0.0–0.5)
Eosinophils Relative: 2 %
HCT: 39 % (ref 36.0–46.0)
Hemoglobin: 12.4 g/dL (ref 12.0–15.0)
Immature Granulocytes: 0 %
Lymphocytes Relative: 40 %
Lymphs Abs: 2.4 K/uL (ref 0.7–4.0)
MCH: 29.7 pg (ref 26.0–34.0)
MCHC: 31.8 g/dL (ref 30.0–36.0)
MCV: 93.5 fL (ref 80.0–100.0)
Monocytes Absolute: 0.5 K/uL (ref 0.1–1.0)
Monocytes Relative: 8 %
Neutro Abs: 3 K/uL (ref 1.7–7.7)
Neutrophils Relative %: 49 %
Platelet Count: 228 K/uL (ref 150–400)
RBC: 4.17 MIL/uL (ref 3.87–5.11)
RDW: 13.2 % (ref 11.5–15.5)
WBC Count: 6.1 K/uL (ref 4.0–10.5)
nRBC: 0 % (ref 0.0–0.2)

## 2024-07-22 LAB — FERRITIN: Ferritin: 17 ng/mL (ref 11–307)

## 2024-07-22 LAB — IRON AND TIBC
Iron: 58 ug/dL (ref 28–170)
Saturation Ratios: 16 % (ref 10.4–31.8)
TIBC: 370 ug/dL (ref 250–450)
UIBC: 312 ug/dL

## 2024-07-27 ENCOUNTER — Encounter: Admitting: Family Medicine

## 2024-07-29 ENCOUNTER — Inpatient Hospital Stay (HOSPITAL_BASED_OUTPATIENT_CLINIC_OR_DEPARTMENT_OTHER): Payer: 59 | Admitting: Oncology

## 2024-07-29 ENCOUNTER — Encounter: Payer: Self-pay | Admitting: Oncology

## 2024-07-29 ENCOUNTER — Inpatient Hospital Stay: Payer: 59

## 2024-07-29 ENCOUNTER — Other Ambulatory Visit: Payer: Self-pay

## 2024-07-29 VITALS — BP 119/84 | HR 90 | Temp 97.8°F | Resp 19

## 2024-07-29 DIAGNOSIS — Z7901 Long term (current) use of anticoagulants: Secondary | ICD-10-CM | POA: Diagnosis not present

## 2024-07-29 DIAGNOSIS — Z86718 Personal history of other venous thrombosis and embolism: Secondary | ICD-10-CM | POA: Diagnosis not present

## 2024-07-29 DIAGNOSIS — D509 Iron deficiency anemia, unspecified: Secondary | ICD-10-CM | POA: Diagnosis not present

## 2024-07-29 DIAGNOSIS — Z79899 Other long term (current) drug therapy: Secondary | ICD-10-CM | POA: Diagnosis not present

## 2024-07-29 DIAGNOSIS — Z86711 Personal history of pulmonary embolism: Secondary | ICD-10-CM | POA: Diagnosis not present

## 2024-07-29 DIAGNOSIS — D508 Other iron deficiency anemias: Secondary | ICD-10-CM | POA: Diagnosis not present

## 2024-07-29 MED ORDER — APIXABAN 2.5 MG PO TABS
2.5000 mg | ORAL_TABLET | Freq: Two times a day (BID) | ORAL | 5 refills | Status: AC
  Filled 2024-07-29: qty 60, 30d supply, fill #0
  Filled 2024-09-08: qty 60, 30d supply, fill #1
  Filled 2024-10-05 – 2024-10-07 (×2): qty 60, 30d supply, fill #2
  Filled 2024-11-11: qty 60, 30d supply, fill #3
  Filled 2024-12-06: qty 60, 30d supply, fill #4
  Filled 2025-01-05: qty 60, 30d supply, fill #5

## 2024-07-29 NOTE — Assessment & Plan Note (Signed)
 Continue Eliquis 2.5 mg twice daily.

## 2024-07-29 NOTE — Progress Notes (Signed)
 Hematology/Oncology Progress note [phone] [phone]      CHIEF COMPLAINTS/REASON FOR VISIT:  pulmonary embolism and DVT, iron  deficiency anemia.   ASSESSMENT & PLAN:   IDA (iron  deficiency anemia)  Lab Results  Component Value Date   HGB 12.4 07/22/2024   TIBC 370 07/22/2024   IRONPCTSAT 16 07/22/2024   FERRITIN 17 07/22/2024    Recommend  otc iron  supplementation.  every other day.   History of DVT (deep vein thrombosis) Continue Eliquis  2.5 mg twice daily.  Orders Placed This Encounter  Procedures   CBC with Differential (Cancer Center Only)    Standing Status:   Future    Expected Date:   01/29/2025    Expiration Date:   07/29/2025   Iron  and TIBC([facility])    Standing Status:   Future    Expected Date:   01/29/2025    Expiration Date:   07/29/2025   Ferritin    Standing Status:   Future    Expected Date:   01/29/2025    Expiration Date:   07/29/2025   Retic Panel    Standing Status:   Future    Expected Date:   01/29/2025    Expiration Date:   07/29/2025   Follow up in 6 months.  All questions were answered. The patient knows to call the clinic with any problems, questions or concerns.  Zelphia Cap, MD, PhD [facility] Hematology Oncology 07/29/2024    HISTORY OF PRESENTING ILLNESS:   Courtney Hanson is a  48 y.o.  female with PMH listed below was seen in consultation at the request of  Wendolyn Jenkins Jansky, MD  for evaluation of history of pulmonary embolism and DVT.  Patient has remote history of 2 episodes of lower extremity DVT. Per patient, first episode was in 2003, she developed acute left lower extremity calf vein DVT after gallbladder surgery.  She was treated with 3 months of anticoagulation  04/09/2013, 05/31/2013 bilateral lower extremity venous duplex showed no right extremity DVT.  Abnormalities consistent with the sequela of the prior venous obstructive process with findings that appear chronic/longstanding in nature and are  not identified in the proximal vein.  05/20/2016, second episode of acute left lower extremity DVT, provoked by birth control pills.  Patient was treated with Eliquis  for a period of time.  05/04/2020.She developed shortness of breath and chest pain.  Her work-up showed unprovoked pulmonary embolism on  Patient was started on anticoagulation and has been on Eliquis  since then.  Currently she is on Eliquis  5 mg twice daily.  She tolerates well.  Denies any active bleeding events.  Patient has had hypercoagulable work-up done in the past.  Negative factor V Leiden mutation, prothrombin gene mutation. She had  adequate Antithrombin 3 level, protein C and S activity. She was seen by hematology at [facility] on 10/08/2020.  Antiphospholipid panel was obtained by hematology was negative for lupus anticoagulant, negative anticardiolipin IgM/IgG, negative beta 2 glycoprotein protein IgG/IgM  Patient has chronic varicose veins on her bilateral lower extremity. Patient denies any shortness of breath.  She has experienced left anterior chest wall pain which she attributes to  pulled muscle.  Patient has family history of breast cancer in sister.  INTERVAL HISTORY Courtney Hanson is a 48 y.o. female who has above history reviewed by me today presents for follow up visit for history of DVT/PE Patient takes eliquis  2.5mg  BID, tolerates well. She denies any bleeding events, chest pain, SOB, leg edema more than  her baseline level.  No new complaints.  Review of Systems  Constitutional:  Negative for appetite change, chills, fatigue and fever.  HENT:   Negative for hearing loss and voice change.   Eyes:  Negative for eye problems.  Respiratory:  Negative for chest tightness and cough.   Cardiovascular:  Negative for chest pain.  Gastrointestinal:  Negative for abdominal distention, abdominal pain and blood in stool.  Endocrine: Negative for hot flashes.  Genitourinary:  Negative for difficulty urinating and  frequency.   Musculoskeletal:  Negative for arthralgias.  Skin:  Negative for itching and rash.  Neurological:  Negative for extremity weakness.  Hematological:  Negative for adenopathy.  Psychiatric/Behavioral:  Negative for confusion.     MEDICAL HISTORY:  Past Medical History:  Diagnosis Date   Anxiety    GERD (gastroesophageal reflux disease)    Lower extremity edema    Pulmonary embolism (HCC)     SURGICAL HISTORY: Past Surgical History:  Procedure Laterality Date   CHOLECYSTECTOMY, LAPAROSCOPIC N/A 2003   COLONOSCOPY WITH PROPOFOL  N/A 10/17/2021   Procedure: COLONOSCOPY WITH PROPOFOL ;  Surgeon: Unk Corinn Skiff, MD;  Location: [facility] ENDOSCOPY;  Service: Gastroenterology;  Laterality: N/A;   DILITATION & CURRETTAGE/HYSTROSCOPY WITH ESSURE     ESOPHAGOGASTRODUODENOSCOPY N/A 10/17/2021   Procedure: ESOPHAGOGASTRODUODENOSCOPY (EGD);  Surgeon: Unk Corinn Skiff, MD;  [facility] ENDOSCOPY;  Service: Gastroenterology;  Laterality: N/A;   LEEP N/A 2003    SOCIAL HISTORY: Social History   Socioeconomic History   Marital status: Single    Spouse name: Not on file   Number of children: 4   Years of education: Not on file   Highest education level: Associate degree: academic program  Occupational History   Not on file  Tobacco Use   Smoking status: Never    Passive exposure: Never   Smokeless tobacco: Never  Vaping Use   Vaping status: Never Used  Substance and Sexual Activity   Alcohol use: Not Currently   Drug use: Never   Sexual activity: Yes    Birth control/protection: Surgical  Other Topics Concern   Not on file  Social History Narrative   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 [employer]   Social Drivers of Health   Financial Resource Strain: Low Risk  (12/10/2023)   Overall Financial Resource Strain (CARDIA)    Difficulty of Paying Living Expenses: Not hard at all  Food Insecurity: No Food Insecurity (12/10/2023)   Hunger Vital Sign     Worried About Running Out of Food in the Last Year: Never true    Ran Out of Food in the Last Year: Never true  Transportation Needs: No Transportation Needs (12/10/2023)   PRAPARE - Administrator, Civil Service (Medical): No    Lack of Transportation (Non-Medical): No  Physical Activity: Unknown (12/10/2023)   Exercise Vital Sign    Days of Exercise per Week: 0 days    Minutes of Exercise per Session: Not on file  Stress: Stress Concern Present (12/10/2023)   [employer] of Occupational Health - Occupational Stress Questionnaire    Feeling of Stress : To some extent  Social Connections: Moderately Isolated (12/10/2023)   Social Connection and Isolation Panel    Frequency of Communication with Friends and Family: Three times a week    Frequency of Social Gatherings with Friends and Family: Once a week    Attends Religious Services: More than 4 times per year    Active Member of [employer] or Organizations:  No    Attends Banker Meetings: Not on file    Marital Status: Never married  Intimate Partner Violence: Not At Risk (06/06/2024)   Received from [facility]   Humiliation, Afraid, Rape, and Kick questionnaire    Within the last year, have you been afraid of your partner or ex-partner?: No    Within the last year, have you been humiliated or emotionally abused in other ways by your partner or ex-partner?: No    Within the last year, have you been kicked, hit, slapped, or otherwise physically hurt by your partner or ex-partner?: No    Within the last year, have you been raped or forced to have any kind of sexual activity by your partner or ex-partner?: No    FAMILY HISTORY: Family History  Problem Relation Age of Onset   Hyperlipidemia Mother    Arthritis Mother    Hypertension Mother    Diabetes Mother    Hyperlipidemia Father    Alcohol abuse Father    Arthritis Father    Hypertension Father    Breast cancer Sister 24   Breast cancer Paternal Aunt     Stomach cancer Paternal Uncle    Cancer Paternal Uncle        unk type   Alcohol abuse Maternal Grandfather    Diabetes Paternal Grandmother    Throat cancer Paternal Grandfather        dx. 77s   Birth defects Paternal Grandfather    Alcohol abuse Paternal Grandfather     ALLERGIES:  is allergic to tetanus toxoid, topiramate, venlafaxine, and latex.  MEDICATIONS:  Current Outpatient Medications  Medication Sig Dispense Refill   albuterol  (VENTOLIN  HFA) 108 (90 Base) MCG/ACT inhaler Inhale 1-2 puffs into the lungs every 6 (six) hours as needed. 6.7 g 0   ALPRAZolam  (XANAX ) 0.5 MG tablet Take 1 tablet (0.5 mg total) by mouth 2 (two) times daily as needed. 30 tablet 1   buPROPion  (WELLBUTRIN  XL) 150 MG 24 hr tablet Take 1 tablet (150 mg total) by mouth daily. 30 tablet 1   cyclobenzaprine (FLEXERIL) 5 MG tablet Take by mouth as needed.     desvenlafaxine  (PRISTIQ ) 100 MG 24 hr tablet Take 1 tablet (100 mg total) by mouth daily. 90 tablet 0   diclofenac Sodium (VOLTAREN) 1 % GEL Apply 2 g topically.     esomeprazole  (NEXIUM ) 40 MG capsule Take 1 capsule (40 mg total) by mouth daily before breakfast. 90 capsule 0   furosemide  (LASIX ) 20 MG tablet Take 1 tablet (20 mg total) by mouth as needed. 30 tablet 3   gabapentin  (NEURONTIN ) 300 MG capsule Take 1 capsule (300 mg total) by mouth at bedtime. 30 capsule 1   hydrOXYzine  (VISTARIL ) 25 MG capsule Take 1 capsule (25 mg total) by mouth every 8 (eight) hours as needed. 30 capsule 3   potassium chloride  (MICRO-K ) 10 MEQ CR capsule Take 2 capsules (20 mEq total) by mouth as needed. 30 capsule 3   promethazine  (PHENERGAN ) 25 MG tablet Take 1 tablet (25 mg total) by mouth every 6 (six) hours as needed for nausea 30 tablet 2   Ubrogepant  (UBRELVY ) 50 MG TABS Take 1 tablet (50 mg total) by mouth daily as needed. 16 tablet 2   apixaban  (ELIQUIS ) 2.5 MG TABS tablet Take 1 tablet (2.5 mg total) by mouth 2 (two) times daily. 60 tablet 5   zolpidem   (AMBIEN  CR) 6.25 MG CR tablet Take 1 tablet (6.25 mg total)  by mouth at bedtime as needed for sleep. (Patient not taking: Reported on 07/29/2024) 30 tablet 1   No current facility-administered medications for this visit.     PHYSICAL EXAMINATION: Today's Vitals   07/29/24 1109  BP: 119/84  Pulse: 90  Resp: 19  Temp: 97.8 F (36.6 C)  SpO2: 99%    Physical Exam Constitutional:      General: She is not in acute distress.    Appearance: She is obese.  HENT:     Head: Normocephalic and atraumatic.  Eyes:     General: No scleral icterus. Cardiovascular:     Rate and Rhythm: Normal rate.  Pulmonary:     Effort: Pulmonary effort is normal. No respiratory distress.  Abdominal:     General: There is no distension.  Musculoskeletal:        General: Normal range of motion.     Cervical back: Normal range of motion and neck supple.     Comments: Right lower extremity varicose vein  Skin:    Findings: No rash.  Neurological:     Mental Status: She is alert and oriented to person, place, and time. Mental status is at baseline.  Psychiatric:        Mood and Affect: Mood normal.     LABORATORY DATA:  I have reviewed the data as listed Lab Results  Component Value Date   WBC 6.1 07/22/2024   HGB 12.4 07/22/2024   HCT 39.0 07/22/2024   MCV 93.5 07/22/2024   PLT 228 07/22/2024   Recent Labs    08/14/23 1208 09/11/23 1039  NA 139 138  K 4.2 4.2  CL 102 105  CO2 31 26  GLUCOSE 89 104*  BUN 12 7  CREATININE 0.62 0.65  CALCIUM 9.2 8.9  GFRNONAA  --  >60  PROT 6.9 7.5  ALBUMIN 3.7 3.7  AST 13 18  ALT 15 20  ALKPHOS 47 50  BILITOT 0.8 0.9   Iron /TIBC/Ferritin/ %Sat    Component Value Date/Time   IRON  58 07/22/2024 1323   TIBC 370 07/22/2024 1323   FERRITIN 17 07/22/2024 1323   IRONPCTSAT 16 07/22/2024 1323      RADIOGRAPHIC STUDIES: I have personally reviewed the radiological images as listed and agreed with the findings in the report. No results found.

## 2024-07-29 NOTE — Assessment & Plan Note (Addendum)
  Lab Results  Component Value Date   HGB 12.4 07/22/2024   TIBC 370 07/22/2024   IRONPCTSAT 16 07/22/2024   FERRITIN 17 07/22/2024    Recommend  otc iron  supplementation.  every other day.

## 2024-08-01 ENCOUNTER — Encounter: Admitting: Obstetrics & Gynecology

## 2024-08-05 ENCOUNTER — Other Ambulatory Visit: Payer: Self-pay

## 2024-08-05 ENCOUNTER — Ambulatory Visit (INDEPENDENT_AMBULATORY_CARE_PROVIDER_SITE_OTHER): Admitting: Family Medicine

## 2024-08-05 ENCOUNTER — Other Ambulatory Visit (HOSPITAL_COMMUNITY)
Admission: RE | Admit: 2024-08-05 | Discharge: 2024-08-05 | Disposition: A | Discharge status: Home / Self Care | Source: Ambulatory Visit | Attending: Family Medicine | Admitting: Family Medicine

## 2024-08-05 ENCOUNTER — Encounter: Payer: Self-pay | Admitting: Family Medicine

## 2024-08-05 VITALS — BP 111/76 | HR 90 | Temp 97.9°F | Resp 18 | Ht 61.0 in | Wt 338.1 lb

## 2024-08-05 DIAGNOSIS — Z113 Encounter for screening for infections with a predominantly sexual mode of transmission: Secondary | ICD-10-CM | POA: Diagnosis not present

## 2024-08-05 DIAGNOSIS — Z0000 Encounter for general adult medical examination without abnormal findings: Secondary | ICD-10-CM | POA: Diagnosis not present

## 2024-08-05 DIAGNOSIS — Z131 Encounter for screening for diabetes mellitus: Secondary | ICD-10-CM | POA: Diagnosis not present

## 2024-08-05 DIAGNOSIS — D500 Iron deficiency anemia secondary to blood loss (chronic): Secondary | ICD-10-CM

## 2024-08-05 DIAGNOSIS — F419 Anxiety disorder, unspecified: Secondary | ICD-10-CM | POA: Diagnosis not present

## 2024-08-05 DIAGNOSIS — F32A Depression, unspecified: Secondary | ICD-10-CM | POA: Diagnosis not present

## 2024-08-05 DIAGNOSIS — H52223 Regular astigmatism, bilateral: Secondary | ICD-10-CM | POA: Diagnosis not present

## 2024-08-05 LAB — IBC + FERRITIN
Ferritin: 13.3 ng/mL (ref 10.0–291.0)
Iron: 77 ug/dL (ref 42–145)
Saturation Ratios: 17.4 % — ABNORMAL LOW (ref 20.0–50.0)
TIBC: 443.8 ug/dL (ref 250.0–450.0)
Transferrin: 317 mg/dL (ref 212.0–360.0)

## 2024-08-05 LAB — COMPREHENSIVE METABOLIC PANEL WITH GFR
ALT: 11 U/L (ref 0–35)
AST: 12 U/L (ref 0–37)
Albumin: 4.2 g/dL (ref 3.5–5.2)
Alkaline Phosphatase: 53 U/L (ref 39–117)
BUN: 9 mg/dL (ref 6–23)
CO2: 30 meq/L (ref 19–32)
Calcium: 9.3 mg/dL (ref 8.4–10.5)
Chloride: 101 meq/L (ref 96–112)
Creatinine, Ser: 0.62 mg/dL (ref 0.40–1.20)
GFR: 104.12 mL/min (ref >60.00)
Glucose, Bld: 90 mg/dL (ref 70–99)
Potassium: 4.1 meq/L (ref 3.5–5.1)
Sodium: 140 meq/L (ref 135–145)
Total Bilirubin: 0.8 mg/dL (ref 0.2–1.2)
Total Protein: 7.3 g/dL (ref 6.0–8.3)

## 2024-08-05 LAB — LIPID PANEL
Cholesterol: 244 mg/dL — ABNORMAL HIGH (ref 0–200)
HDL: 108.3 mg/dL (ref >39.00)
LDL Cholesterol: 116 mg/dL — ABNORMAL HIGH (ref 0–99)
NonHDL: 135.55
Total CHOL/HDL Ratio: 2
Triglycerides: 99 mg/dL (ref 0.0–149.0)
VLDL: 19.8 mg/dL (ref 0.0–40.0)

## 2024-08-05 LAB — CBC WITH DIFFERENTIAL/PLATELET
Basophils Absolute: 0 K/uL (ref 0.0–0.1)
Basophils Relative: 0.6 % (ref 0.0–3.0)
Eosinophils Absolute: 0.1 K/uL (ref 0.0–0.7)
Eosinophils Relative: 1.2 % (ref 0.0–5.0)
HCT: 39.9 % (ref 36.0–46.0)
Hemoglobin: 13 g/dL (ref 12.0–15.0)
Lymphocytes Relative: 51.6 % — ABNORMAL HIGH (ref 12.0–46.0)
Lymphs Abs: 2.4 K/uL (ref 0.7–4.0)
MCHC: 32.5 g/dL (ref 30.0–36.0)
MCV: 92.5 fl (ref 78.0–100.0)
Monocytes Absolute: 0.4 K/uL (ref 0.1–1.0)
Monocytes Relative: 8.6 % (ref 3.0–12.0)
Neutro Abs: 1.8 K/uL (ref 1.4–7.7)
Neutrophils Relative %: 38 % — ABNORMAL LOW (ref 43.0–77.0)
Platelets: 220 K/uL (ref 150.0–400.0)
RBC: 4.32 Mil/uL (ref 3.87–5.11)
RDW: 14.1 % (ref 11.5–15.5)
WBC: 4.6 K/uL (ref 4.0–10.5)

## 2024-08-05 LAB — HEMOGLOBIN A1C: Hgb A1c MFr Bld: 6.3 % (ref 4.6–6.5)

## 2024-08-05 LAB — TSH: TSH: 0.81 u[IU]/mL (ref 0.35–5.50)

## 2024-08-05 LAB — VITAMIN B12: Vitamin B-12: 1124 pg/mL — ABNORMAL HIGH (ref 211–911)

## 2024-08-05 MED ORDER — DESVENLAFAXINE SUCCINATE ER 100 MG PO TB24
100.0000 mg | ORAL_TABLET | Freq: Every day | ORAL | 1 refills | Status: DC
  Filled 2024-08-05 – 2024-10-05 (×2): qty 90, 90d supply, fill #0
  Filled 2025-01-05: qty 90, 90d supply, fill #1

## 2024-08-05 NOTE — Patient Instructions (Signed)
 It was very nice to see you today!  Drink water.  One w/electrolytes.  Compression stockings.    PLEASE NOTE:  If you had any lab tests please let us  know if you have not heard back within a few days. You may see your results on MyChart before we have a chance to review them but we will give you a call once they are reviewed by us . If we ordered any referrals today, please let us  know if you have not heard from their office within the next week.   Please try these tips to maintain a healthy lifestyle:  Eat most of your calories during the day when you are active. Eliminate processed foods including packaged sweets (pies, cakes, cookies), reduce intake of potatoes, white bread, white pasta, and white rice. Look for whole grain options, oat flour or almond flour.  Each meal should contain half fruits/vegetables, one quarter protein, and one quarter carbs (no bigger than a computer mouse).  Cut down on sweet beverages. This includes juice, soda, and sweet tea. Also watch fruit intake, though this is a healthier sweet option, it still contains natural sugar! Limit to 3 servings daily.  Drink at least 1 glass of water with each meal and aim for at least 8 glasses per day  Exercise at least 150 minutes every week.

## 2024-08-05 NOTE — Progress Notes (Signed)
 Phone [phone]   Subjective:   Patient is a 48 y.o. female presenting for annual physical.    Chief Complaint  Patient presents with   Annual Exam    CPE Fasting    Annual-no exercise Discussed the use of AI scribe software for clinical note transcription with the patient, who gave verbal consent to proceed.  History of Present Illness Courtney Hanson is a 48 year old female who presents for her annual physical exam.  She has a history of migraines without auras, primarily occurring around her menstrual cycle. She uses Ubrelvy  as needed, which she finds effective. She has not used Imitrex or Maxalt due to insurance issues.  She experiences episodes of dizziness about three times a week, mostly when standing up. She suspects it might be related to low iron  or potassium levels. She admits to not drinking enough fluids, especially at work.  She experiences heart racing, which she attributes to panic attacks, lasting about two minutes and associated with shortness of breath. These episodes occur more frequently when she is excited or anxious.  She reports a lack of motivation for exercise. She has previously seen a psychiatrist and was on Wellbutrin , which she stopped due to side effects. She continues to take Pristiq  daily and uses Xanax  approximately twice a month as needed. Hydroxyzine  is used rarely, and Ambien  is taken as needed for sleep. She also takes furosemide  for swelling as needed, with potassium supplementation when she does.   She has not had a Pap smear in two to three years but has a gynecologist for follow-up. She reports muscle aches and joint pains.    See problem oriented charting- ROS- ROS: Gen: no fever, chills  Skin: no rash, itching ENT: no ear pain, ear drainage, nasal congestion, rhinorrhea, sinus pressure, sore throat Eyes: no blurry vision, double vision Resp: no cough, wheeze,SOB CV: no CP, LE edema,  GI: no heartburn, n/v/d/c, abd pain GU: no  dysuria, urgency, frequency, hematuria FDX:jryzb Neuro: no  weakness, vertigo.  Occ dizziness Psych: no  SI   The following were reviewed and entered/updated in epic: Past Medical History:  Diagnosis Date   Anxiety    GERD (gastroesophageal reflux disease)    Lower extremity edema    Pulmonary embolism (HCC)    Patient Active Problem List   Diagnosis Date Noted   Radial tunnel syndrome, left 05/06/2024   Paresthesia of arm 12/18/2023   Migraine without aura and without status migrainosus, not intractable 08/15/2023   History of DVT (deep vein thrombosis) 09/13/2022   IDA (iron  deficiency anemia) 09/13/2022   Genetic testing 03/18/2022   History of pulmonary embolism 02/25/2022   Family history of breast cancer 02/25/2022   History of DVT of lower extremity 02/25/2022   Pulmonary embolism (HCC) 02/11/2022   Primary insomnia 02/11/2022   Localized edema 02/11/2022   Adenomatous rectal polyp 11/15/2021   Nausea without vomiting 11/15/2021   Chronic GERD 11/15/2021   Erosive gastritis 11/15/2021   Chronic diarrhea    Prediabetes 10/18/2020   Chronic right-sided thoracic back pain 09/23/2019   Vitamin D deficiency 06/04/2017   Anxiety and depression 06/19/2016   Tachycardia 06/19/2016   Major depressive disorder, recurrent episode, moderate (HCC) 07/13/2015   PMDD (premenstrual dysphoric disorder) 12/21/2013   GAD (generalized anxiety disorder) 06/24/2013   Morbid obesity with BMI of 60.0-69.9, adult (HCC) 06/24/2013   Endometritis 05/31/2013   Past Surgical History:  Procedure Laterality Date   CHOLECYSTECTOMY, LAPAROSCOPIC N/A 2003   COLONOSCOPY  WITH PROPOFOL  N/A 10/17/2021   Procedure: COLONOSCOPY WITH PROPOFOL ;  Surgeon: Unk Corinn Skiff, MD;  Location: [facility] ENDOSCOPY;  Service: Gastroenterology;  Laterality: N/A;   DILITATION & CURRETTAGE/HYSTROSCOPY WITH ESSURE     ESOPHAGOGASTRODUODENOSCOPY N/A 10/17/2021   Procedure: ESOPHAGOGASTRODUODENOSCOPY (EGD);  Surgeon:  Unk Corinn Skiff, MD;  Location: [facility] ENDOSCOPY;  Service: Gastroenterology;  Laterality: N/A;   LEEP N/A 2003    Family History  Problem Relation Age of Onset   Hyperlipidemia Mother    Arthritis Mother    Hypertension Mother    Diabetes Mother    Hyperlipidemia Father    Alcohol abuse Father    Arthritis Father    Hypertension Father    Breast cancer Sister 20   Breast cancer Paternal Aunt    Stomach cancer Paternal Uncle    Cancer Paternal Uncle        unk type   Alcohol abuse Maternal Grandfather    Diabetes Paternal Grandmother    Throat cancer Paternal Grandfather        dx. 82s   Birth defects Paternal Grandfather    Alcohol abuse Paternal Grandfather     Medications- reviewed and updated Current Outpatient Medications  Medication Sig Dispense Refill   albuterol  (VENTOLIN  HFA) 108 (90 Base) MCG/ACT inhaler Inhale 1-2 puffs into the lungs every 6 (six) hours as needed. 6.7 g 0   ALPRAZolam  (XANAX ) 0.5 MG tablet Take 1 tablet (0.5 mg total) by mouth 2 (two) times daily as needed. 30 tablet 1   apixaban  (ELIQUIS ) 2.5 MG TABS tablet Take 1 tablet (2.5 mg total) by mouth 2 (two) times daily. 60 tablet 5   cyclobenzaprine (FLEXERIL) 5 MG tablet Take by mouth as needed.     diclofenac Sodium (VOLTAREN) 1 % GEL Apply 2 g topically.     esomeprazole  (NEXIUM ) 40 MG capsule Take 1 capsule (40 mg total) by mouth daily before breakfast. 90 capsule 0   furosemide  (LASIX ) 20 MG tablet Take 1 tablet (20 mg total) by mouth as needed. 30 tablet 3   gabapentin  (NEURONTIN ) 300 MG capsule Take 1 capsule (300 mg total) by mouth at bedtime. 30 capsule 1   hydrOXYzine  (VISTARIL ) 25 MG capsule Take 1 capsule (25 mg total) by mouth every 8 (eight) hours as needed. 30 capsule 3   potassium chloride  (MICRO-K ) 10 MEQ CR capsule Take 2 capsules (20 mEq total) by mouth as needed. 30 capsule 3   promethazine  (PHENERGAN ) 25 MG tablet Take 1 tablet (25 mg total) by mouth every 6 (six) hours as needed  for nausea 30 tablet 2   Ubrogepant  (UBRELVY ) 50 MG TABS Take 1 tablet (50 mg total) by mouth daily as needed. 16 tablet 2   zolpidem  (AMBIEN  CR) 6.25 MG CR tablet Take 1 tablet (6.25 mg total) by mouth at bedtime as needed for sleep. 30 tablet 1   buPROPion  (WELLBUTRIN  XL) 150 MG 24 hr tablet Take 1 tablet (150 mg total) by mouth daily. (Patient not taking: Reported on 08/05/2024) 30 tablet 1   desvenlafaxine  (PRISTIQ ) 100 MG 24 hr tablet Take 1 tablet (100 mg total) by mouth daily. 90 tablet 1   No current facility-administered medications for this visit.    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 DP 2+B LUNGS: CTAB. No wheezes ABDOMEN:  BS+, soft, NTND, No HSM, no masses EXT:  no edema MSK: no gross abnormalities. MS 5/5 all 4 NEURO: A&O x3.  CN II-XII intact.  PSYCH: normal mood. Good eye contact     Assessment and Plan   Health Maintenance counseling: 1. Anticipatory guidance: Patient counseled regarding regular dental exams q6 months, eye exams,  avoiding smoking and second hand smoke, limiting alcohol to 1 beverage per day, no illicit drugs.   2. Risk factor reduction:  Advised patient of need for regular exercise and diet rich and fruits and  vegetables to reduce risk of heart attack and stroke. Exercise- encouraged.  Wt Readings from Last 3 Encounters:  08/05/24 (!) 338 lb 2 oz (153.4 kg)  05/25/24 (!) 313 lb (142 kg)  03/17/24 (!) 338 lb 3 oz (153.4 kg)   3. Immunizations/screenings/ancillary studies Immunization History  Administered Date(s) Administered   Influenza-Unspecified 10/07/2017   Td 09/08/1990   Health Maintenance Due  Topic Date Due   Hepatitis C Screening  Never done   Hepatitis B Vaccines 19-59 Average Risk (1 of 3 - 19+ 3-dose series) Never done   Cervical Cancer Screening (Pap smear)  05/14/2022   Colonoscopy  10/17/2024    4. Cervical cancer screening- sch gyn 5. Breast cancer screening-  mammogram utd 6. Colon cancer screening - due nov 7. Skin cancer screening- advised regular sunscreen use. Denies worrisome, changing, or new skin lesions.  8. Birth control/STD check- n/a 9. Osteoporosis screening- n/a 10. Smoking associated screening - non smoker  Wellness examination -     CBC with Differential/Platelet -     Comprehensive metabolic panel with GFR -     Lipid panel -     TSH -     Hemoglobin A1c -     IBC + Ferritin -     Vitamin B12 -     HepB+HepC+HIV Panel -     RPR -     HSV(herpes simplex vrs) 1+2 ab-IgG -     Urine cytology ancillary only  Iron  deficiency anemia due to chronic blood loss -     IBC + Ferritin -     Vitamin B12  Routine screening for STI (sexually transmitted infection) -     HepB+HepC+HIV Panel -     RPR -     HSV(herpes simplex vrs) 1+2 ab-IgG -     Urine cytology ancillary only  Anxiety and depression  Other orders -     Desvenlafaxine  Succinate ER; Take 1 tablet (100 mg total) by mouth daily.  Dispense: 90 tablet; Refill: 1  Wellness-anticipatory guidance.  Work on Diet/Exercise  Check CBC,CMP,lipids,TSH, A1C.  F/u 1 yr  Assessment and Plan Assessment & Plan Adult Wellness Visit   An annual physical examination was conducted for insurance reduction  purposes. There  are no new surgeries or changes in family history. She is not exercising and lacks motivation. Perform blood work, including STI screening. Encourage regular exercise, starting with 5 minutes a day. Advise on general health maintenance: stay active, eat right, avoid drinking and driving, abstain from drugs, wear a seatbelt, and see the dentist.  Anxiety disorder   Her anxiety is managed with Pristiq  and Xanax  as needed. She experiences heart racing and shortness of breath during panic attacks, lasting about two minutes, usually when excited or anxious. Wellbutrin  was stopped due to side effects. There are no current suicidal thoughts. Refill Pristiq  and advise keeping a log of heart racing episodes. Continue Xanax  as needed.  Migraine without aura   Migraines occur mostly around her menstrual cycle and are managed with Ubrelvy  as needed, which is effective. No auras are reported. Insurance issues prevent the use of other medications like Imitrex or Maxalt. Continue Ubrelvy  as needed.  Edema, intermittent   Intermittent swelling is managed with furosemide  as needed, with potassium supplementation. Continue furosemide  and potassium as needed.  Insomnia   Insomnia is managed with Ambien  as needed. Continue Ambien  as needed.  Gastroesophageal reflux disease (GERD)   GERD is managed with Nexium  as needed. Continue Nexium  as needed.  Orthostatic dizziness   Dizziness occurs primarily when standing up, possibly due to low blood pressure, dehydration, or electrolyte imbalance. Blood pressure is on the lower side of normal. Increase fluid intake with electrolytes and consider wearing compression stockings.    Recommended follow up: Return in about 1 year (around 08/05/2025) for annual physical.  Lab/Order associations:+ fasting  Jenkins CHRISTELLA Carrel, MD

## 2024-08-06 LAB — HEPB+HEPC+HIV PANEL
HIV Screen 4th Generation wRfx: NONREACTIVE
Hep B C IgM: NEGATIVE
Hep B Core Total Ab: NEGATIVE
Hep B E Ab: NONREACTIVE
Hep B E Ag: NEGATIVE
Hep B Surface Ab, Qual: REACTIVE
Hep C Virus Ab: NONREACTIVE
Hepatitis B Surface Ag: NEGATIVE

## 2024-08-06 LAB — RPR: RPR Ser Ql: NONREACTIVE

## 2024-08-06 LAB — HSV(HERPES SIMPLEX VRS) I + II AB-IGG
HSV 1 IGG,TYPE SPECIFIC AB: 39.7 {index} — ABNORMAL HIGH
HSV 2 IGG,TYPE SPECIFIC AB: <0.9 {index}

## 2024-08-09 ENCOUNTER — Ambulatory Visit: Payer: Self-pay | Admitting: Family Medicine

## 2024-08-09 LAB — URINE CYTOLOGY ANCILLARY ONLY
Chlamydia: NEGATIVE
Comment: NEGATIVE
Comment: NEGATIVE
Comment: NORMAL
Neisseria Gonorrhea: NEGATIVE
Trichomonas: NEGATIVE

## 2024-08-09 NOTE — Progress Notes (Signed)
 Labs ok except  Iron  stores are low-needs to take iron  every other day as rec by heme. A1C(3 month average of sugars) is elevated.  This is considered PreDiabetes.  Work on diet-decrease sugars and starches and aim for 30 minutes of exercise 5 days/week to prevent progression to diabetes She has or has been exposed to at least the oral form(cold sore) of herpes virus  Needs to sch appt in 6 mo for f/u meds, preDiabetes

## 2024-08-11 ENCOUNTER — Encounter: Payer: Self-pay | Admitting: Oncology

## 2024-08-11 ENCOUNTER — Other Ambulatory Visit: Payer: Self-pay | Admitting: Family Medicine

## 2024-08-11 ENCOUNTER — Other Ambulatory Visit: Payer: Self-pay

## 2024-08-11 DIAGNOSIS — J069 Acute upper respiratory infection, unspecified: Secondary | ICD-10-CM

## 2024-08-11 MED ORDER — ALBUTEROL SULFATE HFA 108 (90 BASE) MCG/ACT IN AERS
1.0000 | INHALATION_SPRAY | Freq: Four times a day (QID) | RESPIRATORY_TRACT | 0 refills | Status: AC | PRN
  Filled 2024-08-11: qty 6.7, 25d supply, fill #0

## 2024-08-11 MED ORDER — GUAIFENESIN-CODEINE 100-10 MG/5ML PO SOLN
5.0000 mL | Freq: Every evening | ORAL | 0 refills | Status: DC | PRN
  Filled 2024-08-11: qty 60, 12d supply, fill #0

## 2024-08-12 ENCOUNTER — Encounter: Admitting: Family Medicine

## 2024-09-05 ENCOUNTER — Encounter: Payer: Self-pay | Admitting: Family Medicine

## 2024-09-05 NOTE — Telephone Encounter (Signed)
 Please see pt msg and advise on imaging orders

## 2024-09-08 ENCOUNTER — Encounter: Payer: Self-pay | Admitting: Family Medicine

## 2024-09-29 ENCOUNTER — Ambulatory Visit
Admission: EM | Admit: 2024-09-29 | Discharge: 2024-09-29 | Disposition: A | Discharge status: Home / Self Care | Attending: Family Medicine | Admitting: Family Medicine

## 2024-09-29 DIAGNOSIS — N898 Other specified noninflammatory disorders of vagina: Secondary | ICD-10-CM | POA: Diagnosis not present

## 2024-09-29 DIAGNOSIS — Z113 Encounter for screening for infections with a predominantly sexual mode of transmission: Secondary | ICD-10-CM | POA: Diagnosis not present

## 2024-09-29 NOTE — Discharge Instructions (Signed)
 The clinic will contact you with results of the testing done today if positive.  Please follow-up with your PCP or gynecologist if symptoms do not improve.

## 2024-09-29 NOTE — ED Triage Notes (Signed)
 Pt present for STD testing. Pt states she has noticed a bad smell. Pt denies vaginal discharge and irritation.

## 2024-09-29 NOTE — ED Provider Notes (Signed)
 [facility]    CSN: [id number] Arrival date & time: 09/29/24  1757      History   Chief Complaint No chief complaint on file.   HPI Courtney Hanson is a 48 y.o. female presents for STD testing.  Patient reports a vaginal odor without discharge, dysuria, fevers, vomiting.  No known STD exposure but she would like screening.  No other concerns at this time  HPI  Past Medical History:  Diagnosis Date   Anxiety    GERD (gastroesophageal reflux disease)    Lower extremity edema    Pulmonary embolism [facility])     Patient Active Problem List   Diagnosis Date Noted   Radial tunnel syndrome, left 05/06/2024   Paresthesia of arm 12/18/2023   Migraine without aura and without status migrainosus, not intractable 08/15/2023   History of DVT (deep vein thrombosis) 09/13/2022   IDA (iron  deficiency anemia) 09/13/2022   Genetic testing 03/18/2022   History of pulmonary embolism 02/25/2022   Family history of breast cancer 02/25/2022   History of DVT of lower extremity 02/25/2022   Pulmonary embolism (HCC) 02/11/2022   Primary insomnia 02/11/2022   Localized edema 02/11/2022   Adenomatous rectal polyp 11/15/2021   Nausea without vomiting 11/15/2021   Chronic GERD 11/15/2021   Erosive gastritis 11/15/2021   Chronic diarrhea    Prediabetes 10/18/2020   Chronic right-sided thoracic back pain 09/23/2019   Vitamin D deficiency 06/04/2017   Anxiety and depression 06/19/2016   Tachycardia 06/19/2016   Major depressive disorder, recurrent episode, moderate (HCC) 07/13/2015   PMDD (premenstrual dysphoric disorder) 12/21/2013   GAD (generalized anxiety disorder) 06/24/2013   Morbid obesity with BMI of 60.0-69.9, adult (HCC) 06/24/2013   Endometritis 05/31/2013    Past Surgical History:  Procedure Laterality Date   CHOLECYSTECTOMY, LAPAROSCOPIC N/A 2003   COLONOSCOPY WITH PROPOFOL  N/A 10/17/2021   Procedure: COLONOSCOPY WITH PROPOFOL ;  Surgeon: Unk Corinn Skiff, MD;   Location: [facility] ENDOSCOPY;  Service: Gastroenterology;  Laterality: N/A;   DILITATION & CURRETTAGE/HYSTROSCOPY WITH ESSURE     ESOPHAGOGASTRODUODENOSCOPY N/A 10/17/2021   Procedure: ESOPHAGOGASTRODUODENOSCOPY (EGD);  Surgeon: Unk Corinn Skiff, MD;  Location: [facility] ENDOSCOPY;  Service: Gastroenterology;  Laterality: N/A;   LEEP N/A 2003    OB History   No obstetric history on file.      Home Medications    Prior to Admission medications   Medication Sig Start Date End Date Taking? Authorizing Provider  albuterol  (VENTOLIN  HFA) 108 (90 Base) MCG/ACT inhaler Inhale 1-2 puffs into the lungs every 6 (six) hours as needed. 08/11/24   Wendolyn Jenkins Jansky, MD  ALPRAZolam  (XANAX ) 0.5 MG tablet Take 1 tablet (0.5 mg total) by mouth 2 (two) times daily as needed. 08/14/23   Wendolyn Jenkins Jansky, MD  apixaban  (ELIQUIS ) 2.5 MG TABS tablet Take 1 tablet (2.5 mg total) by mouth 2 (two) times daily. 07/29/24   Babara Call, MD  buPROPion  (WELLBUTRIN  XL) 150 MG 24 hr tablet Take 1 tablet (150 mg total) by mouth daily. Patient not taking: Reported on 08/05/2024 01/01/24   Wendolyn Jenkins Jansky, MD  cyclobenzaprine (FLEXERIL) 5 MG tablet Take by mouth as needed.    [provider]  desvenlafaxine  (PRISTIQ ) 100 MG 24 hr tablet Take 1 tablet (100 mg total) by mouth daily. 08/05/24   Wendolyn Jenkins Jansky, MD  diclofenac Sodium (VOLTAREN) 1 % GEL Apply 2 g topically. 10/18/21   [provider]  esomeprazole  (NEXIUM ) 40 MG capsule Take 1 capsule (40 mg  total) by mouth daily before breakfast. 05/25/23   Wendolyn Jenkins Jansky, MD  furosemide  (LASIX ) 20 MG tablet Take 1 tablet (20 mg total) by mouth as needed. 02/11/22   Wendolyn Jenkins Jansky, MD  gabapentin  (NEURONTIN ) 300 MG capsule Take 1 capsule (300 mg total) by mouth at bedtime. 12/18/23   Matthews, Jason J, MD  guaiFENesin -codeine  100-10 MG/5ML syrup Take 5 mLs by mouth at bedtime as needed for cough. 08/11/24   Wendolyn Jenkins Jansky, MD  hydrOXYzine  (VISTARIL ) 25 MG capsule Take 1  capsule (25 mg total) by mouth every 8 (eight) hours as needed. 08/14/23   Wendolyn Jenkins Jansky, MD  potassium chloride  (MICRO-K ) 10 MEQ CR capsule Take 2 capsules (20 mEq total) by mouth as needed. 02/11/22   Wendolyn Jenkins Jansky, MD  promethazine  (PHENERGAN ) 25 MG tablet Take 1 tablet (25 mg total) by mouth every 6 (six) hours as needed for nausea 08/14/23   Wendolyn Jenkins Jansky, MD  Ubrogepant  (UBRELVY ) 50 MG TABS Take 1 tablet (50 mg total) by mouth daily as needed. 09/30/23   Wendolyn Jenkins Jansky, MD  zolpidem  (AMBIEN  CR) 6.25 MG CR tablet Take 1 tablet (6.25 mg total) by mouth at bedtime as needed for sleep. 08/14/23 08/05/24  Wendolyn Jenkins Jansky, MD    Family History Family History  Problem Relation Age of Onset   Hyperlipidemia Mother    Arthritis Mother    Hypertension Mother    Diabetes Mother    Hyperlipidemia Father    Alcohol abuse Father    Arthritis Father    Hypertension Father    Breast cancer Sister 39   Breast cancer Paternal Aunt    Stomach cancer Paternal Uncle    Cancer Paternal Uncle        unk type   Alcohol abuse Maternal Grandfather    Diabetes Paternal Grandmother    Throat cancer Paternal Grandfather        dx. 51s   Birth defects Paternal Grandfather    Alcohol abuse Paternal Grandfather     Social History Social History   Tobacco Use   Smoking status: Never    Passive exposure: Never   Smokeless tobacco: Never  Vaping Use   Vaping status: Never Used  Substance Use Topics   Alcohol use: Not Currently   Drug use: Never     Allergies   Tetanus toxoid, Topiramate, Venlafaxine, and Latex   Review of Systems Review of Systems  Genitourinary:        Vaginal odor     Physical Exam Triage Vital Signs ED Triage Vitals  Encounter Vitals Group     BP 09/29/24 1806 122/86     Girls Systolic BP Percentile --      Girls Diastolic BP Percentile --      Boys Systolic BP Percentile --      Boys Diastolic BP Percentile --      Pulse Rate 09/29/24 1806 95     Resp  09/29/24 1806 15     Temp 09/29/24 1806 98.4 F (36.9 C)     Temp src --      SpO2 09/29/24 1806 92 %     Weight --      Height --      Head Circumference --      Peak Flow --      Pain Score 09/29/24 1805 0     Pain Loc --      Pain Education --      Exclude  from Growth Chart --    No data found.  Updated Vital Signs BP 122/86   Pulse 95   Temp 98.4 F (36.9 C)   Resp 15   LMP 08/27/2024 (Exact Date)   SpO2 92%   Visual Acuity Right Eye Distance:   Left Eye Distance:   Bilateral Distance:    Right Eye Near:   Left Eye Near:    Bilateral Near:     Physical Exam Vitals and nursing note reviewed.  Constitutional:      Appearance: Normal appearance.  HENT:     Head: Normocephalic and atraumatic.  Eyes:     Pupils: Pupils are equal, round, and reactive to light.  Cardiovascular:     Rate and Rhythm: Normal rate.  Pulmonary:     Effort: Pulmonary effort is normal.  Skin:    General: Skin is warm and dry.  Neurological:     General: No focal deficit present.     Mental Status: She is alert and oriented to person, place, and time.  Psychiatric:        Mood and Affect: Mood normal.        Behavior: Behavior normal.      UC Treatments / Results  Labs (all labs ordered are listed, but only abnormal results are displayed) Labs Reviewed  RPR  HIV ANTIBODY (ROUTINE TESTING W REFLEX)  CERVICOVAGINAL ANCILLARY ONLY    EKG   Radiology No results found.  Procedures Procedures (including critical care time)  Medications Ordered in UC Medications - No data to display  Initial Impression / Assessment and Plan / UC Course  I have reviewed the triage vital signs and the nursing notes.  Pertinent labs & imaging results that were available during my care of the patient were reviewed by me and considered in my medical decision making (see chart for details).     Vaginal swab/STD testing as ordered and will contact for any positive results.  PCP or GYN  follow-up if symptoms do not improve Final Clinical Impressions(s) / UC Diagnoses   Final diagnoses:  Vaginal odor  Screening examination for STD (sexually transmitted disease)     Discharge Instructions      The clinic will contact you with results of the testing done today if positive.  Please follow-up with your PCP or gynecologist if symptoms do not improve.    ED Prescriptions   None    PDMP not reviewed this encounter.   Loreda Myla SAUNDERS, NP 09/29/24 [phone]

## 2024-09-30 LAB — CERVICOVAGINAL ANCILLARY ONLY
Bacterial Vaginitis (gardnerella): NEGATIVE
Candida Glabrata: NEGATIVE
Candida Vaginitis: POSITIVE — AB
Chlamydia: NEGATIVE
Comment: NEGATIVE
Comment: NEGATIVE
Comment: NEGATIVE
Comment: NEGATIVE
Comment: NEGATIVE
Comment: NORMAL
Neisseria Gonorrhea: NEGATIVE
Trichomonas: NEGATIVE

## 2024-10-01 LAB — HIV ANTIBODY (ROUTINE TESTING W REFLEX): HIV Screen 4th Generation wRfx: NONREACTIVE

## 2024-10-01 LAB — RPR: RPR Ser Ql: NONREACTIVE

## 2024-10-03 ENCOUNTER — Other Ambulatory Visit: Payer: Self-pay

## 2024-10-03 ENCOUNTER — Ambulatory Visit (HOSPITAL_COMMUNITY): Payer: Self-pay

## 2024-10-03 MED ORDER — FLUCONAZOLE 150 MG PO TABS
150.0000 mg | ORAL_TABLET | Freq: Once | ORAL | 0 refills | Status: AC
  Filled 2024-10-03: qty 2, 2d supply, fill #0

## 2024-10-05 ENCOUNTER — Encounter: Payer: Self-pay | Admitting: Oncology

## 2024-10-05 ENCOUNTER — Encounter: Payer: Self-pay | Admitting: Family Medicine

## 2024-10-05 ENCOUNTER — Other Ambulatory Visit: Payer: Self-pay

## 2024-10-07 ENCOUNTER — Other Ambulatory Visit: Payer: Self-pay

## 2024-10-07 ENCOUNTER — Encounter: Payer: Self-pay | Admitting: Oncology

## 2024-10-09 ENCOUNTER — Other Ambulatory Visit: Payer: Self-pay

## 2024-10-14 ENCOUNTER — Other Ambulatory Visit: Payer: Self-pay

## 2024-10-14 ENCOUNTER — Ambulatory Visit: Admitting: Family Medicine

## 2024-10-14 VITALS — BP 114/82 | HR 95 | Ht 61.0 in

## 2024-10-14 DIAGNOSIS — M65311 Trigger thumb, right thumb: Secondary | ICD-10-CM

## 2024-10-14 NOTE — Patient Instructions (Signed)
 Thank you for coming in today.   You received an injection today. Seek immediate medical attention if the joint becomes red, extremely painful, or is oozing fluid.   Check back as needed  Have a lovely holiday season

## 2024-10-14 NOTE — Progress Notes (Signed)
   LILLETTE Ileana Collet, PhD, LAT, ATC acting as a scribe for Artist Lloyd, MD.  Courtney Hanson is a 48 y.o. female who presents to [facility] today for exacerbation of her R trigger thumb. Pt was last seen for her thumb on 02/26/24 and was given a R trigger thumb steroid injection.   Today, pt reports R thumb pain and triggering returned about 2-wks ago. She notes that the pain is worse than before  Pertinent review of systems: No fevers or chills  Relevant historical information: History of a pulmonary embolism.  History of trigger finger right thumb last injected March 2025   Exam:  BP 114/82   Pulse 95   Ht 5' 1 (1.549 m)   LMP 08/27/2024 (Exact Date)   SpO2 95%   BMI 63.89 kg/m  General: Well Developed, well nourished, and in no acute distress.   MSK: Right hand normal appearing Mildly tender palpation palmar first MCP. Triggering is present with flexion of IP joint.   Lab and Radiology Results  Procedure: Real-time Ultrasound Guided Injection of right first MCP tendon sheath at A1 pulley.  Trigger finger injection Device: Philips Affiniti 50G/GE Logiq Images permanently stored and available for review in PACS Verbal informed consent obtained.  Discussed risks and benefits of procedure. Warned about infection, bleeding, hyperglycemia damage to structures among others. Patient expresses understanding and agreement Time-out conducted.   Noted no overlying erythema, induration, or other signs of local infection.   Skin prepped in a sterile fashion.   Local anesthesia: Topical Ethyl chloride.   With sterile technique and under real time ultrasound guidance: 40 mg of Kenalog and 1 mL of lidocaine  injected into A1 pulley tendon sheath. Fluid seen entering the tendon sheath.   Completed without difficulty   Pain immediately resolved suggesting accurate placement of the medication.   Advised to call if fevers/chills, erythema, induration, drainage,  or persistent bleeding.   Images permanently stored and available for review in the ultrasound unit.  Impression: Technically successful ultrasound guided injection.      Assessment and Plan: 48 y.o. female with right hand trigger finger.  This is a recurrent problem.  Previous injection was performed in March 2025.  Plan for repeat injection today and double Band-Aid splint.  If this does not work well or symptoms returned quickly neck step would likely be surgical consultation.   PDMP not reviewed this encounter. Orders Placed This Encounter  Procedures   US  LIMITED JOINT SPACE STRUCTURES UP RIGHT(NO LINKED CHARGES)    Reason for Exam (SYMPTOM  OR DIAGNOSIS REQUIRED):   right thumb pain    Preferred imaging location?:   [facility]   No orders of the defined types were placed in this encounter.    Discussed warning signs or symptoms. Please see discharge instructions. Patient expresses understanding.   The above documentation has been reviewed and is accurate and complete Artist Lloyd, M.D.

## 2024-10-21 ENCOUNTER — Encounter: Admitting: Family Medicine

## 2024-11-11 ENCOUNTER — Other Ambulatory Visit: Payer: Self-pay | Admitting: Family Medicine

## 2024-11-11 ENCOUNTER — Other Ambulatory Visit: Payer: Self-pay

## 2024-11-22 ENCOUNTER — Encounter: Payer: Self-pay | Admitting: Family Medicine

## 2024-11-28 ENCOUNTER — Ambulatory Visit
Admission: EM | Admit: 2024-11-28 | Discharge: 2024-11-28 | Disposition: A | Discharge status: Home / Self Care | Attending: Family Medicine | Admitting: Family Medicine

## 2024-11-28 ENCOUNTER — Other Ambulatory Visit (HOSPITAL_COMMUNITY): Payer: Self-pay

## 2024-11-28 ENCOUNTER — Other Ambulatory Visit: Payer: Self-pay

## 2024-11-28 ENCOUNTER — Inpatient Hospital Stay: Admission: RE | Admit: 2024-11-28 | Source: Ambulatory Visit

## 2024-11-28 DIAGNOSIS — M542 Cervicalgia: Secondary | ICD-10-CM | POA: Diagnosis not present

## 2024-11-28 MED ORDER — METHYLPREDNISOLONE 4 MG PO TBPK
ORAL_TABLET | ORAL | 0 refills | Status: DC
  Filled 2024-11-28 (×2): qty 21, 6d supply, fill #0

## 2024-11-28 MED ORDER — HYDROCODONE-ACETAMINOPHEN 7.5-325 MG PO TABS
1.0000 | ORAL_TABLET | Freq: Four times a day (QID) | ORAL | 0 refills | Status: DC | PRN
  Filled 2024-11-28 (×2): qty 10, 3d supply, fill #0

## 2024-11-28 MED ORDER — TIZANIDINE HCL 4 MG PO TABS
4.0000 mg | ORAL_TABLET | Freq: Four times a day (QID) | ORAL | 0 refills | Status: AC | PRN
  Filled 2024-11-28 (×2): qty 21, 3d supply, fill #0

## 2024-11-28 NOTE — Discharge Instructions (Signed)
 May try ice or heat to painful neck muscles Take the Medrol  Dosepak.  Take all of day 1 today as a single dose before bed.  Tomorrow start on schedule Try tizanidine  as a muscle relaxer.  May take 1 or 2 at bedtime. I have prescribed hydrocodone  to take when pain is severe.  Do not drive on hydrocodone  See your doctor if not improving by the end of the week

## 2024-11-28 NOTE — ED Provider Notes (Signed)
 " [facility]    CSN: [id number] Arrival date & time: 11/28/24  1727      History   Chief Complaint Chief Complaint  Patient presents with   Neck Injury    HPI Courtney Hanson is a 48 y.o. female.   Patient woke up with severe neck pain on Saturday.  It has persisted.  She tried Flexeril.  It has not helped.  She had no accident or injury.  No prior history of neck problems.  No new activity or overuse.  No pain that radiates into the arm.  No headache    Past Medical History:  Diagnosis Date   Anxiety    GERD (gastroesophageal reflux disease)    Lower extremity edema    Pulmonary embolism [facility])     Patient Active Problem List   Diagnosis Date Noted   Trigger thumb, right thumb 10/14/2024   Radial tunnel syndrome, left 05/06/2024   Paresthesia of arm 12/18/2023   Migraine without aura and without status migrainosus, not intractable 08/15/2023   History of DVT (deep vein thrombosis) 09/13/2022   IDA (iron  deficiency anemia) 09/13/2022   Genetic testing 03/18/2022   History of pulmonary embolism 02/25/2022   Family history of breast cancer 02/25/2022   History of DVT of lower extremity 02/25/2022   Pulmonary embolism (HCC) 02/11/2022   Primary insomnia 02/11/2022   Localized edema 02/11/2022   Adenomatous rectal polyp 11/15/2021   Nausea without vomiting 11/15/2021   Chronic GERD 11/15/2021   Erosive gastritis 11/15/2021   Chronic diarrhea    Prediabetes 10/18/2020   Chronic right-sided thoracic back pain 09/23/2019   Vitamin D deficiency 06/04/2017   Anxiety and depression 06/19/2016   Tachycardia 06/19/2016   Major depressive disorder, recurrent episode, moderate (HCC) 07/13/2015   PMDD (premenstrual dysphoric disorder) 12/21/2013   GAD (generalized anxiety disorder) 06/24/2013   Morbid obesity with BMI of 60.0-69.9, adult (HCC) 06/24/2013   Endometritis 05/31/2013    Past Surgical History:  Procedure Laterality Date   CHOLECYSTECTOMY,  LAPAROSCOPIC N/A 2003   COLONOSCOPY WITH PROPOFOL  N/A 10/17/2021   Procedure: COLONOSCOPY WITH PROPOFOL ;  Surgeon: Unk Corinn Skiff, MD;  Location: [facility] ENDOSCOPY;  Service: Gastroenterology;  Laterality: N/A;   DILITATION & CURRETTAGE/HYSTROSCOPY WITH ESSURE     ESOPHAGOGASTRODUODENOSCOPY N/A 10/17/2021   Procedure: ESOPHAGOGASTRODUODENOSCOPY (EGD);  Surgeon: Unk Corinn Skiff, MD;  Location: [facility] ENDOSCOPY;  Service: Gastroenterology;  Laterality: N/A;   LEEP N/A 2003    OB History   No obstetric history on file.      Home Medications    Prior to Admission medications  Medication Sig Start Date End Date Taking? Authorizing Provider  HYDROcodone -acetaminophen  (NORCO) 7.5-325 MG tablet Take 1 tablet by mouth every 6 (six) hours as needed for moderate pain (pain score 4-6). 11/28/24  Yes Maranda Jamee Jacob, MD  methylPREDNISolone  (MEDROL  DOSEPAK) 4 MG TBPK tablet tad 11/28/24  Yes Maranda Jamee Jacob, MD  tiZANidine  (ZANAFLEX ) 4 MG tablet Take 1-2 tablets (4-8 mg total) by mouth every 6 (six) hours as needed for muscle spasms. 11/28/24  Yes Maranda Jamee Jacob, MD  albuterol  (VENTOLIN  HFA) 108 [phone] Base) MCG/ACT inhaler Inhale 1-2 puffs into the lungs every 6 (six) hours as needed. 08/11/24   Wendolyn Jenkins Jansky, MD  ALPRAZolam  (XANAX ) 0.5 MG tablet Take 1 tablet (0.5 mg total) by mouth 2 (two) times daily as needed. 08/14/23   Wendolyn Jenkins Jansky, MD  apixaban  (ELIQUIS ) 2.5 MG TABS tablet Take 1 tablet (2.5 mg total)  by mouth 2 (two) times daily. 07/29/24   Babara Call, MD  desvenlafaxine  (PRISTIQ ) 100 MG 24 hr tablet Take 1 tablet (100 mg total) by mouth daily. 08/05/24   Wendolyn Jenkins Jansky, MD  esomeprazole  (NEXIUM ) 40 MG capsule Take 1 capsule (40 mg total) by mouth daily before breakfast. 05/25/23   Wendolyn Jenkins Jansky, MD  furosemide  (LASIX ) 20 MG tablet Take 1 tablet (20 mg total) by mouth as needed. 02/11/22   Wendolyn Jenkins Jansky, MD  potassium chloride  (MICRO-K ) 10 MEQ CR capsule Take 2 capsules (20 mEq  total) by mouth as needed. 02/11/22   Wendolyn Jenkins Jansky, MD  promethazine  (PHENERGAN ) 25 MG tablet Take 1 tablet (25 mg total) by mouth every 6 (six) hours as needed for nausea 08/14/23   Wendolyn Jenkins Jansky, MD  Ubrogepant  (UBRELVY ) 50 MG TABS Take 1 tablet (50 mg total) by mouth daily as needed. 09/30/23   Wendolyn Jenkins Jansky, MD  zolpidem  (AMBIEN  CR) 6.25 MG CR tablet Take 1 tablet (6.25 mg total) by mouth at bedtime as needed for sleep. 08/14/23 08/05/24  Wendolyn Jenkins Jansky, MD    Family History Family History  Problem Relation Age of Onset   Hyperlipidemia Mother    Arthritis Mother    Hypertension Mother    Diabetes Mother    Hyperlipidemia Father    Alcohol abuse Father    Arthritis Father    Hypertension Father    Breast cancer Sister 65   Breast cancer Paternal Aunt    Stomach cancer Paternal Uncle    Cancer Paternal Uncle        unk type   Alcohol abuse Maternal Grandfather    Diabetes Paternal Grandmother    Throat cancer Paternal Grandfather        dx. 39s   Birth defects Paternal Grandfather    Alcohol abuse Paternal Grandfather     Social History Social History[1]   Allergies   Tetanus toxoid, Topiramate, Venlafaxine, and Latex   Review of Systems Review of Systems See HPI  Physical Exam Triage Vital Signs ED Triage Vitals  Encounter Vitals Group     BP 11/28/24 1743 (!) 142/89     Girls Systolic BP Percentile --      Girls Diastolic BP Percentile --      Boys Systolic BP Percentile --      Boys Diastolic BP Percentile --      Pulse Rate 11/28/24 1743 (!) 108     Resp 11/28/24 1743 20     Temp 11/28/24 1743 98.3 F (36.8 C)     Temp src --      SpO2 11/28/24 1743 95 %     Weight --      Height --      Head Circumference --      Peak Flow --      Pain Score 11/28/24 1746 6     Pain Loc --      Pain Education --      Exclude from Growth Chart --    No data found.  Updated Vital Signs BP (!) 142/89   Pulse (!) 108   Temp 98.3 F (36.8 C)   Resp 20    LMP 10/27/2024 (Approximate)   SpO2 95%       Physical Exam Constitutional:      General: She is in acute distress.     Appearance: She is well-developed. She is obese.     Comments: Stiff uncomfortable movements  HENT:     Head: Normocephalic and atraumatic.  Eyes:     Conjunctiva/sclera: Conjunctivae normal.     Pupils: Pupils are equal, round, and reactive to light.  Neck:      Comments: Tender right paraspinous muscles and tender at.  No bony tenderness.  No trapezius tenderness.  Normal neuro Cardiovascular:     Rate and Rhythm: Normal rate.  Pulmonary:     Effort: Pulmonary effort is normal. No respiratory distress.  Abdominal:     General: There is no distension.     Palpations: Abdomen is soft.  Musculoskeletal:        General: Normal range of motion.     Cervical back: Normal range of motion. Tenderness present.  Skin:    General: Skin is warm and dry.  Neurological:     Mental Status: She is alert.      UC Treatments / Results  Labs (all labs ordered are listed, but only abnormal results are displayed) Labs Reviewed - No data to display  EKG   Radiology No results found.  Procedures Procedures (including critical care time)  Medications Ordered in UC Medications - No data to display  Initial Impression / Assessment and Plan / UC Course  I have reviewed the triage vital signs and the nursing notes.  Pertinent labs & imaging results that were available during my care of the patient were reviewed by me and considered in my medical decision making (see chart for details).     Final Clinical Impressions(s) / UC Diagnoses   Final diagnoses:  Musculoskeletal neck pain     Discharge Instructions      May try ice or heat to painful neck muscles Take the Medrol  Dosepak.  Take all of day 1 today as a single dose before bed.  Tomorrow start on schedule Try tizanidine  as a muscle relaxer.  May take 1 or 2 at bedtime. I have prescribed  hydrocodone  to take when pain is severe.  Do not drive on hydrocodone  See your doctor if not improving by the end of the week   ED Prescriptions     Medication Sig Dispense Auth. Provider   methylPREDNISolone  (MEDROL  DOSEPAK) 4 MG TBPK tablet tad 21 tablet Maranda Jamee Jacob, MD   tiZANidine  (ZANAFLEX ) 4 MG tablet Take 1-2 tablets (4-8 mg total) by mouth every 6 (six) hours as needed for muscle spasms. 21 tablet Maranda Jamee Jacob, MD   HYDROcodone -acetaminophen  (NORCO) 7.5-325 MG tablet Take 1 tablet by mouth every 6 (six) hours as needed for moderate pain (pain score 4-6). 10 tablet Maranda Jamee Jacob, MD      I have reviewed the PDMP during this encounter.    [1]  Social History Tobacco Use   Smoking status: Never    Passive exposure: Never   Smokeless tobacco: Never  Vaping Use   Vaping status: Never Used  Substance Use Topics   Alcohol use: Never   Drug use: Never     Maranda Jamee Jacob, MD 11/28/24 1819  "

## 2024-11-28 NOTE — ED Triage Notes (Signed)
 C/o right neck pain since Saturday, woke up with it. Took flexeril, which did not help. No otc meds.

## 2024-11-29 ENCOUNTER — Other Ambulatory Visit (HOSPITAL_COMMUNITY): Payer: Self-pay

## 2024-12-02 ENCOUNTER — Encounter: Admitting: Family Medicine

## 2024-12-12 ENCOUNTER — Encounter: Payer: Self-pay | Admitting: Family Medicine

## 2024-12-13 NOTE — Telephone Encounter (Signed)
 Patient notified of response via MyChart.

## 2024-12-15 ENCOUNTER — Other Ambulatory Visit: Payer: Self-pay | Admitting: Family Medicine

## 2024-12-15 ENCOUNTER — Ambulatory Visit: Admitting: Family Medicine

## 2024-12-15 DIAGNOSIS — Z1231 Encounter for screening mammogram for malignant neoplasm of breast: Secondary | ICD-10-CM

## 2025-01-05 ENCOUNTER — Other Ambulatory Visit: Payer: Self-pay

## 2025-01-05 ENCOUNTER — Ambulatory Visit (INDEPENDENT_AMBULATORY_CARE_PROVIDER_SITE_OTHER): Admitting: Family Medicine

## 2025-01-05 ENCOUNTER — Encounter: Payer: Self-pay | Admitting: Family Medicine

## 2025-01-05 VITALS — BP 122/78 | HR 84 | Temp 97.0°F | Ht 61.0 in

## 2025-01-05 DIAGNOSIS — G43009 Migraine without aura, not intractable, without status migrainosus: Secondary | ICD-10-CM | POA: Diagnosis not present

## 2025-01-05 DIAGNOSIS — F5101 Primary insomnia: Secondary | ICD-10-CM | POA: Diagnosis not present

## 2025-01-05 DIAGNOSIS — R7303 Prediabetes: Secondary | ICD-10-CM | POA: Diagnosis not present

## 2025-01-05 DIAGNOSIS — F32A Depression, unspecified: Secondary | ICD-10-CM

## 2025-01-05 DIAGNOSIS — F419 Anxiety disorder, unspecified: Secondary | ICD-10-CM | POA: Diagnosis not present

## 2025-01-05 DIAGNOSIS — F900 Attention-deficit hyperactivity disorder, predominantly inattentive type: Secondary | ICD-10-CM | POA: Insufficient documentation

## 2025-01-05 DIAGNOSIS — K219 Gastro-esophageal reflux disease without esophagitis: Secondary | ICD-10-CM | POA: Diagnosis not present

## 2025-01-05 MED ORDER — ESOMEPRAZOLE MAGNESIUM 40 MG PO CPDR
40.0000 mg | DELAYED_RELEASE_CAPSULE | Freq: Every day | ORAL | 0 refills | Status: AC
  Filled 2025-01-05: qty 90, 90d supply, fill #0

## 2025-01-05 MED ORDER — LORAZEPAM 0.5 MG PO TABS
0.5000 mg | ORAL_TABLET | Freq: Two times a day (BID) | ORAL | 0 refills | Status: AC | PRN
  Filled 2025-01-05: qty 30, 15d supply, fill #0

## 2025-01-05 MED ORDER — DESVENLAFAXINE SUCCINATE ER 100 MG PO TB24: 100.0000 mg | ORAL_TABLET | Freq: Every day | ORAL | 1 refills | Status: AC

## 2025-01-05 MED ORDER — ATOMOXETINE HCL 40 MG PO CAPS
40.0000 mg | ORAL_CAPSULE | Freq: Every morning | ORAL | 1 refills | Status: AC
  Filled 2025-01-05: qty 30, 30d supply, fill #0

## 2025-01-05 NOTE — Patient Instructions (Signed)
 It was very nice to see you today!  Strattera -1 per day for at 3 days then increase to 2.     PLEASE NOTE:  If you had any lab tests please let us  know if you have not heard back within a few days. You may see your results on MyChart before we have a chance to review them but we will give you a call once they are reviewed by us . If we ordered any referrals today, please let us  know if you have not heard from their office within the next week.   Please try these tips to maintain a healthy lifestyle:  Eat most of your calories during the day when you are active. Eliminate processed foods including packaged sweets (pies, cakes, cookies), reduce intake of potatoes, white bread, white pasta, and white rice. Look for whole grain options, oat flour or almond flour.  Each meal should contain half fruits/vegetables, one quarter protein, and one quarter carbs (no bigger than a computer mouse).  Cut down on sweet beverages. This includes juice, soda, and sweet tea. Also watch fruit intake, though this is a healthier sweet option, it still contains natural sugar! Limit to 3 servings daily.  Drink at least 1 glass of water with each meal and aim for at least 8 glasses per day  Exercise at least 150 minutes every week.

## 2025-01-05 NOTE — Progress Notes (Signed)
 "  Subjective:     Patient ID: Courtney Hanson, female    DOB: [birth year], 49 y.o.   MRN: [id number]  Chief Complaint  Patient presents with   Anxiety    Pt is here to for chronic issues    Discussed the use of AI scribe software for clinical note transcription with the patient, who gave verbal consent to proceed.  History of Present Illness Courtney Hanson is a 49 year old female who presents with difficulty concentrating and potential ADHD.  She experiences difficulty concentrating and suspects she may have ADHD. She has not been formally tested for ADHD but was previously prescribed Vyvanse, which she discontinued due to side effects. She describes lifelong symptoms of starting tasks but not finishing them, getting easily sidetracked, and losing interest quickly. These symptoms have persisted into adulthood, affecting her ability to complete work tasks, especially when working remotely.  She is currently taking Pristiq  100 mg daily for anxiety and reports an increase in her use of Xanax , now taking it approximately once a month for daytime anxiety, which is more frequent than before. Xanax  causes significant sedation, so she takes it at home. She has not taken any lorazepam   She also takes Eliquis  for blood clots and reports rare use of Lasix , zolpidem , and Nexium . She takes Nexium  once or twice a month and requests a refill. She has prediabetes and has been working on her diet and exercise, although she notes some weight gain recently.  No thoughts of suicide. No frequent migraines and does not need a refill for zolpidem  or Lasix .    Health Maintenance Due  Topic Date Due   Zoster Vaccines- Shingrix (1 of 2) Never done   Pneumococcal Vaccine: 50+ Years (1 of 1 - PCV) Never done   Colonoscopy  10/17/2024    Past Medical History:  Diagnosis Date   Anxiety    GERD (gastroesophageal reflux disease)    Lower extremity edema    Pulmonary embolism (HCC)     Past Surgical History:   Procedure Laterality Date   CHOLECYSTECTOMY, LAPAROSCOPIC N/A 2003   COLONOSCOPY WITH PROPOFOL  N/A 10/17/2021   Procedure: COLONOSCOPY WITH PROPOFOL ;  Surgeon: Unk Corinn Skiff, MD;  Location: [facility] ENDOSCOPY;  Service: Gastroenterology;  Laterality: N/A;   DILITATION & CURRETTAGE/HYSTROSCOPY WITH ESSURE     ESOPHAGOGASTRODUODENOSCOPY N/A 10/17/2021   Procedure: ESOPHAGOGASTRODUODENOSCOPY (EGD);  Surgeon: Unk Corinn Skiff, MD;  Location: [facility] ENDOSCOPY;  Service: Gastroenterology;  Laterality: N/A;   LEEP N/A 2003    Current Medications[1]  Allergies[2] ROS neg/noncontributory except as noted HPI/below      Objective:     BP 122/78 (BP Location: Left Arm, Patient Position: Sitting, Cuff Size: Large)   Pulse 84   Temp (!) 97 F (36.1 C) (Temporal)   Ht 5' 1 (1.549 m)   SpO2 98%   BMI 63.89 kg/m  Wt Readings from Last 3 Encounters:  08/05/24 (!) 338 lb 2 oz (153.4 kg)  05/25/24 (!) 313 lb (142 kg)  03/17/24 (!) 338 lb 3 oz (153.4 kg)    Physical Exam GENERAL: Well developed well nourished no acute distress HEAD EYES EARS NOSE THROAT: Normocephalic atraumatic, conjunctiva not injected, sclera nonicteric CARDIAC: Regular rate and rhythm, S1 S2 present , no murmur, dorsalis pedis 2 plus bilaterally NECK: Supple, no thyromegaly, no nodes, no carotid bruits LUNGS: Clear to auscultation bilaterally, no wheezes ABDOMEN: Bowel sounds present, soft, non tender non distended, no hepatosplenomegaly, no masses EXTREMITIES: No edema MUSCULOSKELETAL: No  gross abnormalities NEUROLOGICAL: Alert and oriented x3, cranial nerves II through XII intact PSYCHIATRIC: Normal mood, good eye contact       Assessment & Plan:  Chronic GERD -     Esomeprazole  Magnesium ; Take 1 capsule (40 mg total) by mouth daily before breakfast.  Dispense: 90 capsule; Refill: 0  Anxiety and depression -     LORazepam ; Take 1 tablet (0.5 mg total) by mouth 2 (two) times daily as needed for anxiety.   Dispense: 30 tablet; Refill: 0 -     Desvenlafaxine  Succinate ER; Take 1 tablet (100 mg total) by mouth daily.  Dispense: 90 tablet; Refill: 1  Prediabetes  Primary insomnia  Attention deficit hyperactivity disorder (ADHD), predominantly inattentive type -     Atomoxetine  HCl; Take 1 capsule (40 mg total) by mouth every morning.  Dispense: 30 capsule; Refill: 1  Migraine without aura and without status migrainosus, not intractable    Assessment and Plan Assessment & Plan Attention-deficit hyperactivity disorder, predominantly inattentive type   She experiences long-standing difficulty with concentration and task completion, consistent with ADHD, predominantly inattentive type. A previous trial of Vyvanse was not tolerated due to side effects. Strattera , a non-stimulant option, is considered due to its lower side effect profile and suitability. Strattera  requires time to build up in the system and may cause nausea initially. Initiated Strattera  40mg , one tablet daily for at least three days, then increase to two tablets daily. Advised taking Strattera  with food initially to minimize nausea. Instructed to monitor for side effects and adjust dosage as needed.  Anxiety disorder   She has increased frequency of Xanax  use, now approximately once a month, primarily for daytime anxiety. Discussed potential for increased dependency with more frequent use. Consideration of switching to lorazepam  for smoother effect and potentially better management of anxiety symptoms. Switched from alprazolam  to lorazepam  for anxiety management. Advised trial of half a lorazepam  tablet to assess effectiveness and side effects. Pdmp checked  Migraine without aura   No recent increase in migraine frequency. Current medication regimen includes Ubrelvy , though not covered by insurance  Gastroesophageal reflux disease   Intermittent use of Nexium , approximately once or twice a month, for GERD management. Refilled Nexium   prescription.  Prediabetes   Recent weight gain after previous weight loss. She is working on diet and exercise, though recent snowfall has disrupted routine. Encouraged continuation of diet and exercise regimen to manage weight and blood sugar levels.  Insomnia-occ use ambien     Return in about 4 weeks (around 02/02/2025) for mood-virtual.   summer for annual.  Jenkins CHRISTELLA Carrel, MD     [1]  Current Outpatient Medications:    albuterol  (VENTOLIN  HFA) 108 (90 Base) MCG/ACT inhaler, Inhale 1-2 puffs into the lungs every 6 (six) hours as needed., Disp: 6.7 g, Rfl: 0   apixaban  (ELIQUIS ) 2.5 MG TABS tablet, Take 1 tablet (2.5 mg total) by mouth 2 (two) times daily., Disp: 60 tablet, Rfl: 5   atomoxetine  (STRATTERA ) 40 MG capsule, Take 1 capsule (40 mg total) by mouth every morning., Disp: 30 capsule, Rfl: 1   furosemide  (LASIX ) 20 MG tablet, Take 1 tablet (20 mg total) by mouth as needed., Disp: 30 tablet, Rfl: 3   LORazepam  (ATIVAN ) 0.5 MG tablet, Take 1 tablet (0.5 mg total) by mouth 2 (two) times daily as needed for anxiety., Disp: 30 tablet, Rfl: 0   potassium chloride  (MICRO-K ) 10 MEQ CR capsule, Take 2 capsules (20 mEq total) by mouth as needed., Disp: 30  capsule, Rfl: 3   promethazine  (PHENERGAN ) 25 MG tablet, Take 1 tablet (25 mg total) by mouth every 6 (six) hours as needed for nausea, Disp: 30 tablet, Rfl: 2   tiZANidine  (ZANAFLEX ) 4 MG tablet, Take 1-2 tablets (4-8 mg total) by mouth every 6 (six) hours as needed for muscle spasms., Disp: 21 tablet, Rfl: 0   zolpidem  (AMBIEN  CR) 6.25 MG CR tablet, Take 1 tablet (6.25 mg total) by mouth at bedtime as needed for sleep., Disp: 30 tablet, Rfl: 1   desvenlafaxine  (PRISTIQ ) 100 MG 24 hr tablet, Take 1 tablet (100 mg total) by mouth daily., Disp: 90 tablet, Rfl: 1   esomeprazole  (NEXIUM ) 40 MG capsule, Take 1 capsule (40 mg total) by mouth daily before breakfast., Disp: 90 capsule, Rfl: 0 [2]  Allergies Allergen Reactions   Tetanus Toxoid  Dermatitis, Hives, Itching and Rash   Topiramate     Shaking hands & whoozy   Venlafaxine     Elevation of blood pressure   Latex     Latex birth Control   "

## 2025-01-06 ENCOUNTER — Ambulatory Visit

## 2025-01-06 ENCOUNTER — Other Ambulatory Visit: Payer: Self-pay

## 2025-01-06 DIAGNOSIS — Z1231 Encounter for screening mammogram for malignant neoplasm of breast: Secondary | ICD-10-CM

## 2025-01-13 ENCOUNTER — Encounter: Payer: Self-pay | Admitting: Family Medicine

## 2025-01-20 ENCOUNTER — Ambulatory Visit

## 2025-01-27 ENCOUNTER — Other Ambulatory Visit

## 2025-02-03 ENCOUNTER — Ambulatory Visit: Admitting: Oncology

## 2025-02-03 ENCOUNTER — Ambulatory Visit: Admitting: Family Medicine

## 2025-02-03 ENCOUNTER — Ambulatory Visit

## 2025-08-10 ENCOUNTER — Encounter: Admitting: Family Medicine
